# Patient Record
Sex: Female | Born: 1965 | Race: White | Hispanic: No | Smoking: Never smoker
Health system: Southern US, Community
[De-identification: ages and names within clinical notes are randomized; demographics above are authoritative.]

## PROBLEM LIST (undated history)

## (undated) DIAGNOSIS — E785 Hyperlipidemia, unspecified: Secondary | ICD-10-CM

## (undated) DIAGNOSIS — K219 Gastro-esophageal reflux disease without esophagitis: Secondary | ICD-10-CM

## (undated) DIAGNOSIS — I890 Lymphedema, not elsewhere classified: Secondary | ICD-10-CM

## (undated) DIAGNOSIS — M1990 Unspecified osteoarthritis, unspecified site: Secondary | ICD-10-CM

## (undated) DIAGNOSIS — R200 Anesthesia of skin: Secondary | ICD-10-CM

## (undated) DIAGNOSIS — T753XXA Motion sickness, initial encounter: Secondary | ICD-10-CM

## (undated) DIAGNOSIS — M069 Rheumatoid arthritis, unspecified: Secondary | ICD-10-CM

## (undated) DIAGNOSIS — G4730 Sleep apnea, unspecified: Secondary | ICD-10-CM

## (undated) DIAGNOSIS — R112 Nausea with vomiting, unspecified: Secondary | ICD-10-CM

## (undated) DIAGNOSIS — Z9889 Other specified postprocedural states: Secondary | ICD-10-CM

## (undated) HISTORY — DX: Hyperlipidemia, unspecified: E78.5

## (undated) HISTORY — DX: Sleep apnea, unspecified: G47.30

## (undated) HISTORY — PX: NASAL SINUS SURGERY: SHX719

## (undated) HISTORY — PX: ABDOMINAL HYSTERECTOMY: SHX81

## (undated) HISTORY — PX: CHOLECYSTECTOMY: SHX55

---

## 2005-11-25 ENCOUNTER — Ambulatory Visit: Payer: Self-pay | Admitting: Internal Medicine

## 2006-04-08 ENCOUNTER — Ambulatory Visit: Payer: Self-pay | Admitting: Internal Medicine

## 2007-03-03 ENCOUNTER — Ambulatory Visit: Payer: Self-pay | Admitting: Gastroenterology

## 2007-03-03 IMAGING — US ABDOMEN ULTRASOUND
1 series · 17 of 25 positions shown · non-contrast
Comparison: none

REASON FOR EXAM: abd pain   epigastric
COMMENTS:

[Series 1: abdomen ultrasound · 17 of 57 slices shown]
[im 1/57]
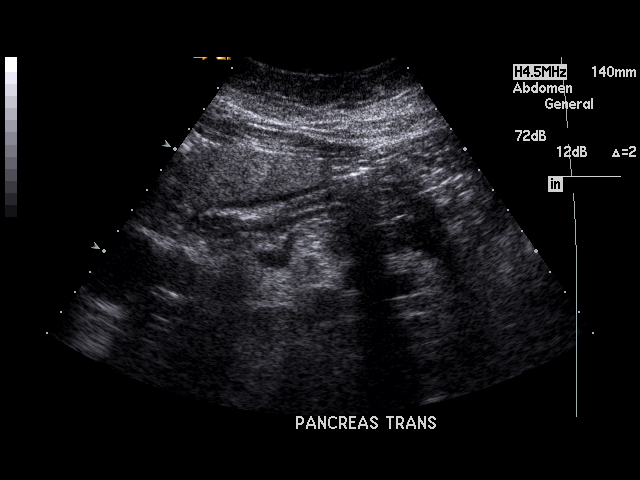
[im 5/57]
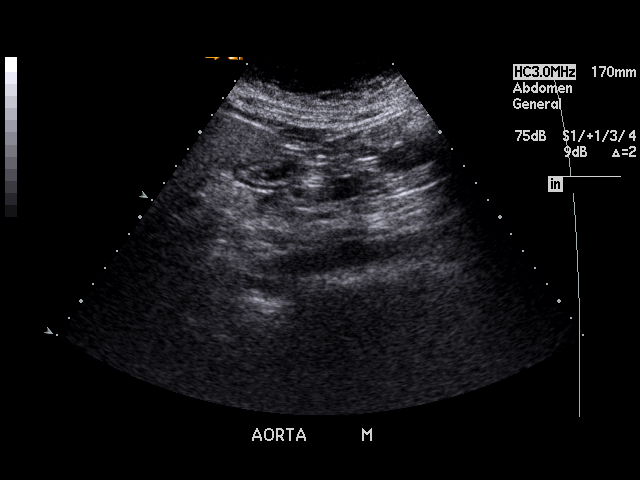
[im 8/57]
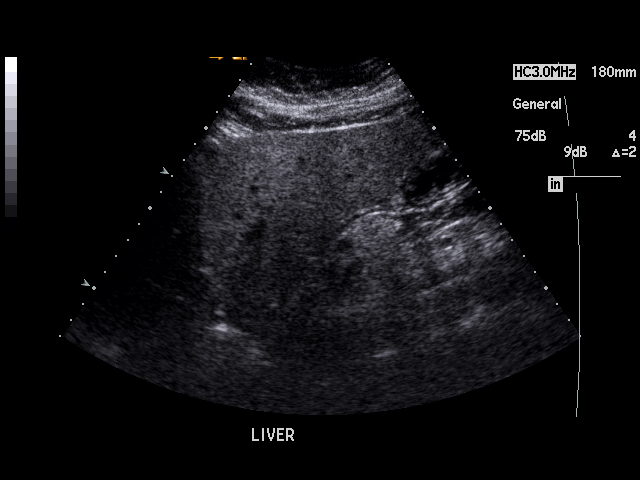
[im 12/57]
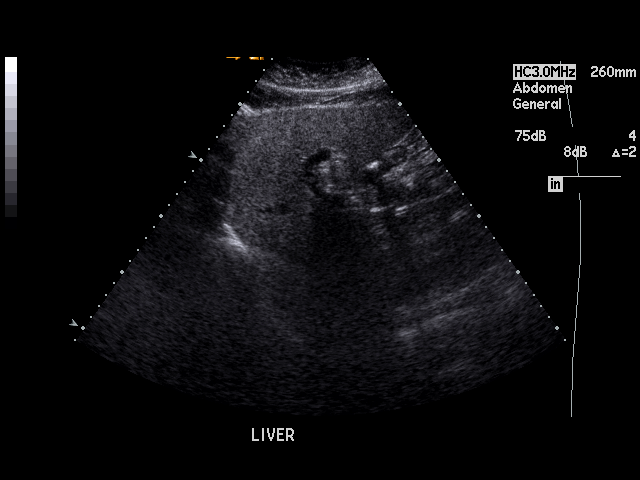
[im 15/57]
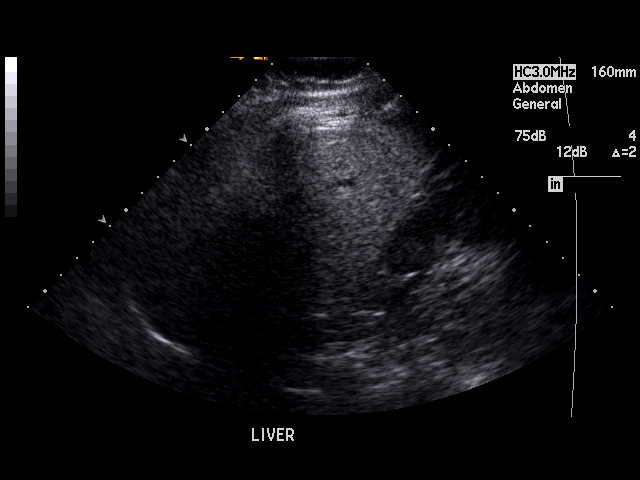
[im 19/57]
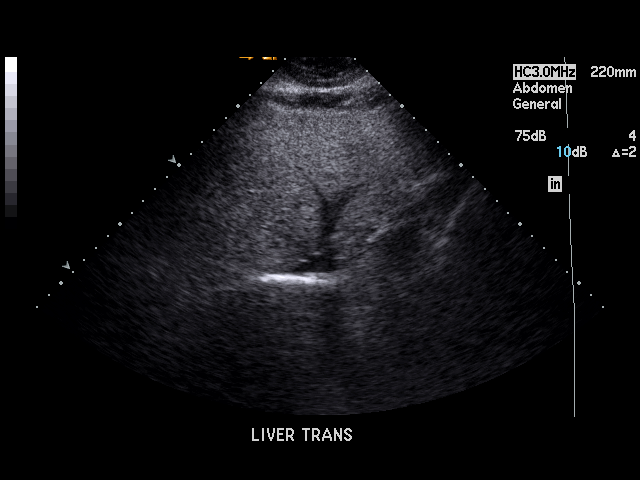
[im 22/57]
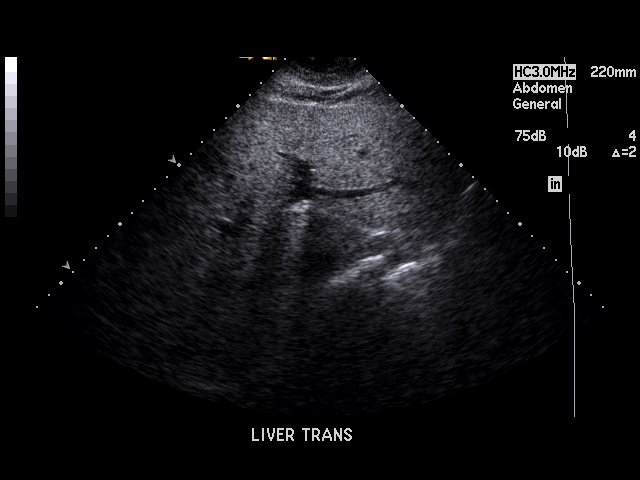
[im 26/57]
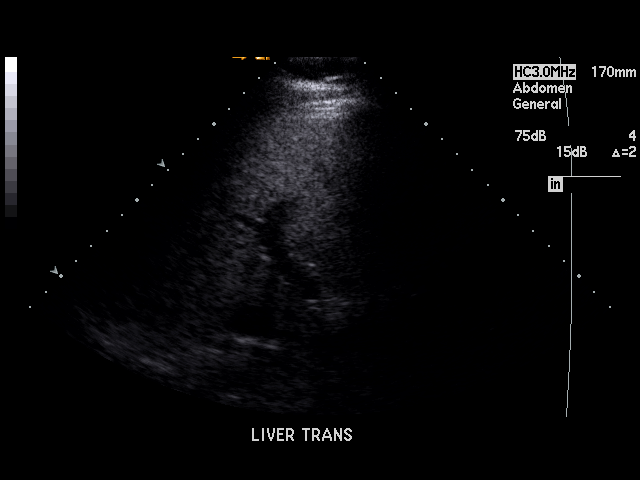
[im 29/57]
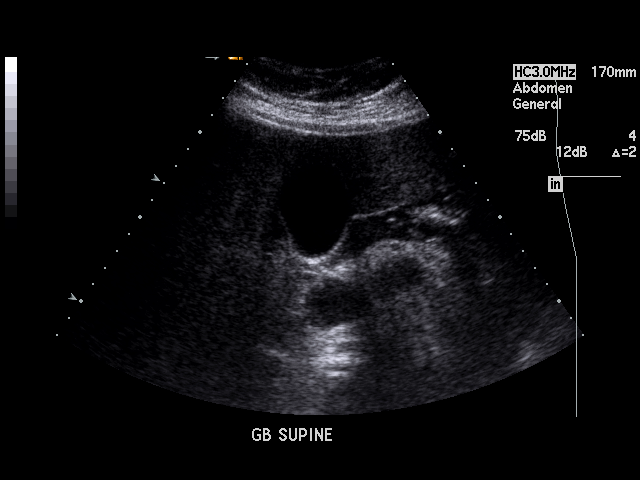
[im 31/57]
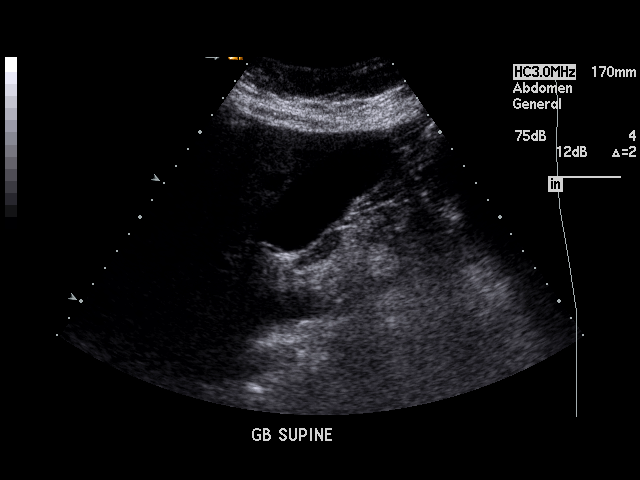
[im 36/57]
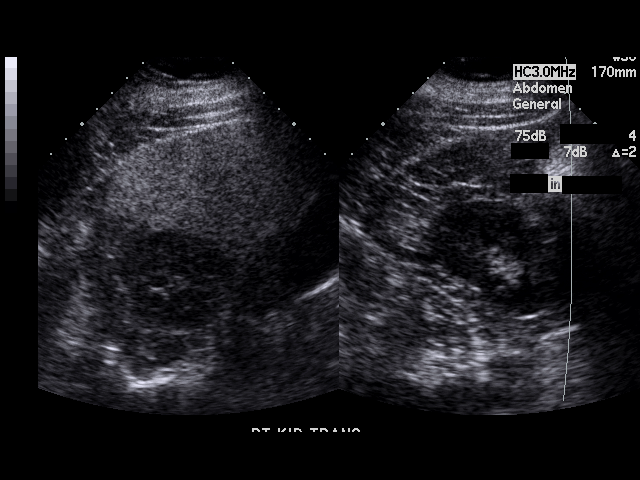
[im 38/57]
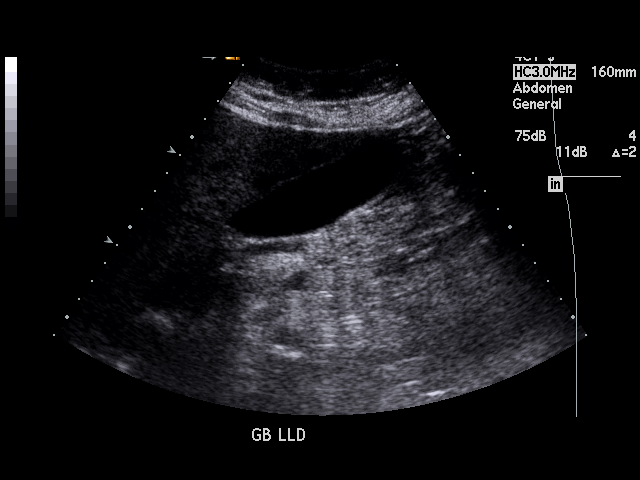
[im 43/57]
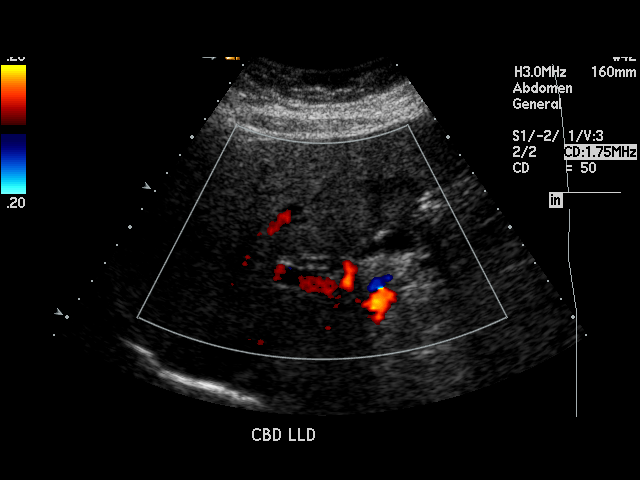
[im 45/57]
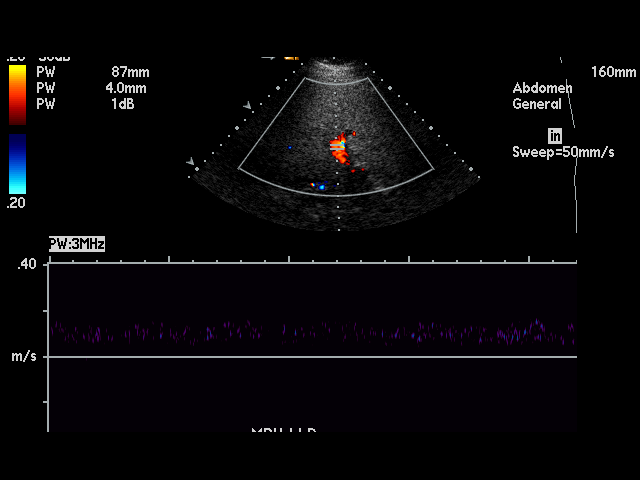
[im 50/57]
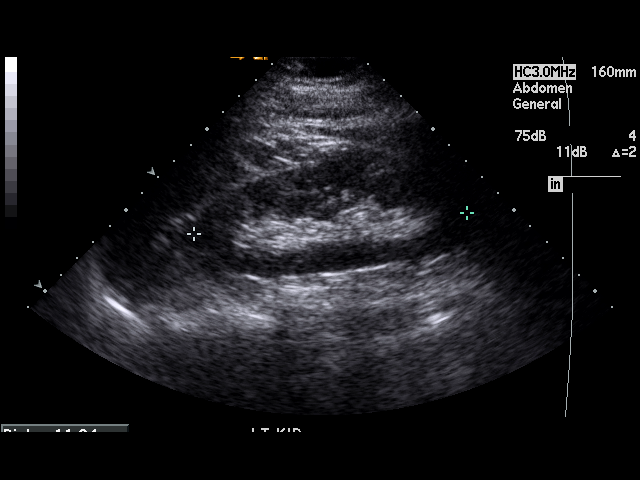
[im 52/57]
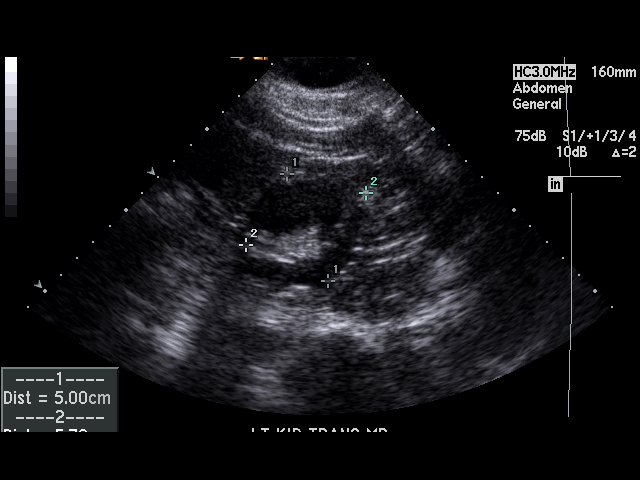
[im 57/57]
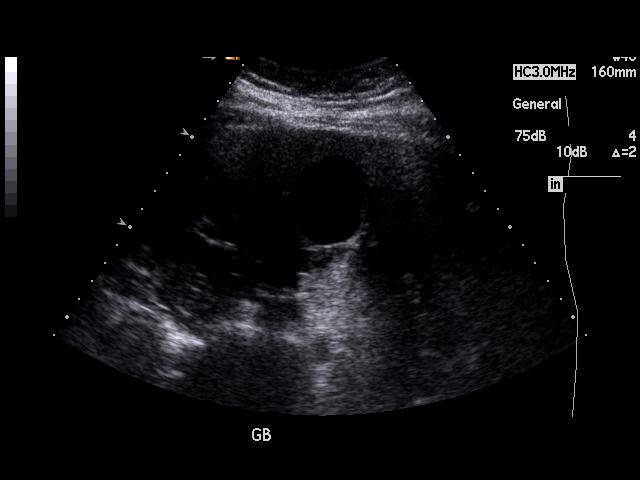

[17 of 25 positions shown; findings below may reference images not displayed]

PROCEDURE:     US  - US ABDOMEN GENERAL SURVEY  - [DATE]  [DATE]

RESULT:     The liver exhibits mildly increased echotexture consistent with
fatty infiltration. No mass or ductal dilation is seen. Portal venous flow
is normal in direction toward the liver. The gallbladder is adequately
distended with no evidence of stones, wall thickening, or pericholecystic
fluid. The common bile duct is normal at 3.2 mm in diameter. Portions of the
pancreas were demonstrated and these appeared normal. The spleen, abdominal
aorta, and kidneys are normal in appearance.
IMPRESSION: 1. There is no evidence of obstruction of the biliary tree. No gallstones
are identified.
2. There is limited evaluation of the pancreas.
3. There are findings consistent with fatty infiltration of the liver.

## 2007-03-15 ENCOUNTER — Ambulatory Visit: Payer: Self-pay | Admitting: General Surgery

## 2007-03-20 ENCOUNTER — Ambulatory Visit: Payer: Self-pay | Admitting: General Surgery

## 2007-06-09 ENCOUNTER — Ambulatory Visit: Payer: Self-pay | Admitting: Gastroenterology

## 2008-08-21 ENCOUNTER — Ambulatory Visit: Payer: Self-pay | Admitting: Internal Medicine

## 2008-09-12 ENCOUNTER — Ambulatory Visit: Payer: Self-pay | Admitting: Internal Medicine

## 2009-02-25 ENCOUNTER — Ambulatory Visit: Payer: Self-pay

## 2010-03-05 ENCOUNTER — Ambulatory Visit: Payer: Self-pay | Admitting: Otolaryngology

## 2012-03-28 ENCOUNTER — Ambulatory Visit: Payer: Self-pay | Admitting: Obstetrics & Gynecology

## 2012-03-28 LAB — CBC
HCT: 37.3 % (ref 35.0–47.0)
MCH: 32.3 pg (ref 26.0–34.0)
MCHC: 34.7 g/dL (ref 32.0–36.0)
Platelet: 187 10*3/uL (ref 150–440)
RBC: 4.01 10*6/uL (ref 3.80–5.20)
RDW: 12.9 % (ref 11.5–14.5)
WBC: 7.6 10*3/uL (ref 3.6–11.0)

## 2012-03-28 LAB — PREGNANCY, URINE: Pregnancy Test, Urine: NEGATIVE m[IU]/mL

## 2012-04-06 ENCOUNTER — Ambulatory Visit: Payer: Self-pay | Admitting: Obstetrics & Gynecology

## 2012-04-07 LAB — PATHOLOGY REPORT

## 2012-09-19 ENCOUNTER — Ambulatory Visit: Payer: Self-pay | Admitting: Obstetrics & Gynecology

## 2012-09-26 ENCOUNTER — Ambulatory Visit: Payer: Self-pay | Admitting: Obstetrics & Gynecology

## 2012-09-26 LAB — CBC
HCT: 39.6 % (ref 35.0–47.0)
HGB: 13.1 g/dL (ref 12.0–16.0)
MCH: 30.4 pg (ref 26.0–34.0)
MCHC: 33 g/dL (ref 32.0–36.0)
MCV: 92 fL (ref 80–100)
Platelet: 228 10*3/uL (ref 150–440)
RBC: 4.31 10*6/uL (ref 3.80–5.20)
WBC: 11.6 10*3/uL — ABNORMAL HIGH (ref 3.6–11.0)

## 2014-01-08 DIAGNOSIS — E785 Hyperlipidemia, unspecified: Secondary | ICD-10-CM | POA: Insufficient documentation

## 2014-01-08 DIAGNOSIS — Z79899 Other long term (current) drug therapy: Secondary | ICD-10-CM | POA: Insufficient documentation

## 2014-01-08 DIAGNOSIS — I82409 Acute embolism and thrombosis of unspecified deep veins of unspecified lower extremity: Secondary | ICD-10-CM | POA: Insufficient documentation

## 2014-01-08 DIAGNOSIS — G4733 Obstructive sleep apnea (adult) (pediatric): Secondary | ICD-10-CM | POA: Insufficient documentation

## 2014-01-29 ENCOUNTER — Emergency Department: Payer: Self-pay | Admitting: Emergency Medicine

## 2014-01-29 LAB — URINALYSIS, COMPLETE
BILIRUBIN, UR: NEGATIVE
Glucose,UR: NEGATIVE mg/dL (ref 0–75)
KETONE: NEGATIVE
LEUKOCYTE ESTERASE: NEGATIVE
NITRITE: NEGATIVE
Ph: 5 (ref 4.5–8.0)
Protein: NEGATIVE
RBC,UR: <1 /HPF (ref 0–5)
SPECIFIC GRAVITY: 1.017 (ref 1.003–1.030)
Squamous Epithelial: 1
WBC UR: 1 /HPF (ref 0–5)

## 2014-01-29 LAB — CBC WITH DIFFERENTIAL/PLATELET
Basophil #: 0.1 10*3/uL (ref 0.0–0.1)
Basophil %: 1 %
EOS ABS: 0.2 10*3/uL (ref 0.0–0.7)
Eosinophil %: 1.5 %
HCT: 41.3 % (ref 35.0–47.0)
HGB: 13.4 g/dL (ref 12.0–16.0)
Lymphocyte #: 3 10*3/uL (ref 1.0–3.6)
Lymphocyte %: 23.7 %
MCH: 30.3 pg (ref 26.0–34.0)
MCHC: 32.3 g/dL (ref 32.0–36.0)
MCV: 94 fL (ref 80–100)
MONOS PCT: 8.3 %
Monocyte #: 1 x10 3/mm — ABNORMAL HIGH (ref 0.2–0.9)
NEUTROS PCT: 65.5 %
Neutrophil #: 8.3 10*3/uL — ABNORMAL HIGH (ref 1.4–6.5)
Platelet: 229 10*3/uL (ref 150–440)
RBC: 4.41 10*6/uL (ref 3.80–5.20)
RDW: 13.8 % (ref 11.5–14.5)
WBC: 12.6 10*3/uL — AB (ref 3.6–11.0)

## 2014-01-29 LAB — PROTIME-INR
INR: 1.1
PROTHROMBIN TIME: 14 s (ref 11.5–14.7)

## 2014-05-07 ENCOUNTER — Ambulatory Visit: Payer: Self-pay | Admitting: Internal Medicine

## 2014-06-14 ENCOUNTER — Ambulatory Visit: Payer: Self-pay | Admitting: Internal Medicine

## 2014-06-18 ENCOUNTER — Ambulatory Visit: Payer: Self-pay | Admitting: Physician Assistant

## 2014-07-15 DIAGNOSIS — I82409 Acute embolism and thrombosis of unspecified deep veins of unspecified lower extremity: Secondary | ICD-10-CM

## 2014-07-15 HISTORY — DX: Acute embolism and thrombosis of unspecified deep veins of unspecified lower extremity: I82.409

## 2014-12-10 NOTE — Op Note (Signed)
PATIENT NAME:  Sharon Barnes, Sharon Barnes MR#:  [id number] DATE OF BIRTH:  [birth year]  DATE OF PROCEDURE:  04/06/2012  PREOPERATIVE DIAGNOSES:  1. Retained IUD. 2. Menorrhagia. 3. Fibroids.   POSTOPERATIVE DIAGNOSES:  1. Retained IUD. 2. Menorrhagia. 3. Fibroids.   PROCEDURES PERFORMED:  1. Hysteroscopy. 2. Dilation and curettage. 3. Myomectomy (resection of fibroid). 4. Removal of retained intrauterine device.   SURGEON: Glean Salen, MD  ANESTHESIA: General.   ESTIMATED BLOOD LOSS: Minimal.   COMPLICATIONS: None.   FINDINGS: IUD in uterine cavity. Two submucosal fibroids. One fibroid completely submucosal at the fundus and another fibroid that is partially submucosal impinging into the intrauterine cavity.   SPECIMEN: Fibroids.   DISPOSITION: To recovery room in stable condition.   TECHNIQUE: Patient is prepped and draped in the usual sterile fashion after adequate anesthesia is obtained in the dorsal lithotomy position. The bladder is drained with a Robinson catheter. 30 degree hysteroscope is inserted with glycine distention of intrauterine cavity with the above-mentioned findings noted. The hysteroscope is removed and a grasping forceps is placed within the uterine cavity with removal of intrauterine device. A resectoscope is then inserted with continued glycine fluid management. Using a curved wire loop resectoscope the fibroids are carefully excised and removed using coagulation power. Some bleeding is noted but hemostasis is assured at the conclusion of the case. Specimen is retrieved and sent to pathology for further review. Patient has a minimal discrepancy of glycine fluid at the conclusion of the case. Patient tolerates well and goes to recovery room in stable condition. All sponge, instrument, and needle counts are correct.   ____________________________ R. Barnett Applebaum, MD rph:cms D: 04/06/2012 13:57:22 ET T: 04/06/2012 15:33:01 ET JOB#: [id number]  cc: Glean Salen, MD,  <Dictator> Gae Dry MD ELECTRONICALLY SIGNED 04/07/2012 7:08

## 2014-12-13 NOTE — Op Note (Signed)
PATIENT NAME:  Sharon Barnes, Sharon Barnes MR#:  [id number] DATE OF BIRTH:  07-10-1966  DATE OF PROCEDURE:  09/26/2012  PREOPERATIVE DIAGNOSIS:    Fibroid uterus with menometrorrhagia.   POSTOPERATIVE DIAGNOSIS:  Fibroid uterus with menometrorrhagia.  PROCEDURE PERFORMED:  Laparoscopic supracervical hysterectomy.  SURGEON:  Glean Salen, MD  ASSISTANT:  Prentice Docker, MD  ANESTHESIA:   General.  ESTIMATED BLOOD LOSS:  50 mL  COMPLICATIONS:  None.  FINDINGS:  Fibroid enlarged uterus, likely greater than 250 grams, normal ovaries, no adhesions.  DISPOSITION:  To recovery room in stable condition.  PROCEDURE IN DETAIL:  The patient was prepped and draped in the usual sterile fashion after adequate anesthesia was obtained in the dorsal lithotomy position.  A Foley catheter was inserted and a sponge stick placed per vagina for manipulation purposes.  Attention was then turned to the abdomen where a Veress needle was inserted through a 5-mm infraumbilical incision after Marcaine infused to anesthetize the skin.  The Veress needle was placed and is confirmed using the hanging drop technique and the abdomen is then insufflated with CO2 gas.  A 5 mm trocar was then under direct visualization with the laparoscope with no injuries or bleeding noted.  The patient was placed in Trendelenburg position with the above-mentioned findings visualized.  A 5 mm trocar was placed in the left lower quadrant and a 12 mm trocar was placed in the right lower quadrant lateral to the inferior epigastric blood vessels with no injuries or bleeding noted.  The uterus was grasped and the uterine ovarian blood vessels and the ligaments were carefully coagulated with the bipolar cautery, cauterized and cut using the 5 mm Harmonic scalpel.  With further dissection, using the Harmonic scalpel to dissect the fallopian tube and round ligament and broad ligament complex all the way down to the level of the uterine arteries.  The  uterine arteries at this point were then coagulated carefully using the bipolar cautery device and incised with subsequent amputation of the uterus at the level of the cervix with approximately 1 cm length of cervix left in place.  The endocervical canal was cauterized using electrocautery.    The morcellator device was placed through the right lower quadrant incision after the trocars were removed and the uterus was removed by morcellating processing without complication. The pelvic cavity was irrigated with aspiration of all fluid with excellent hemostasis noted.  Examination of the ureter, bowel and bladder reveals no apparent injury or complications.  Intercede was placed over the cervical stump area.  Vicryl suture was used to close the right lower quadrant fascia after the morcellator device was removed.  Gas was expelled, the trocars were removed and skin was closed with Dermabond.   Cystoscopy was performed with saline distention of the bladder and observation of ureter flow.  Indigo carmine had been injected through the IV and blue urine was seen to egress from each ureteral orifice.  The cystoscope was removed and the Foley catheter was replaced and goes to the recovery room with the patient.  The patient does go to the recovery room in stable condition, having tolerated the procedure well.  All sponge, instrument and needle counts were correct.    ____________________________ R. Barnett Applebaum, MD rph:ct D: 09/26/2012 11:20:31 ET T: 09/26/2012 13:05:33 ET JOB#: [id number]  cc: Glean Salen, MD, <Dictator> Gae Dry MD ELECTRONICALLY SIGNED 09/27/2012 3:26

## 2015-02-03 DIAGNOSIS — R739 Hyperglycemia, unspecified: Secondary | ICD-10-CM | POA: Insufficient documentation

## 2015-04-02 ENCOUNTER — Other Ambulatory Visit: Payer: Self-pay | Admitting: Obstetrics & Gynecology

## 2015-04-02 DIAGNOSIS — R928 Other abnormal and inconclusive findings on diagnostic imaging of breast: Secondary | ICD-10-CM

## 2015-04-08 ENCOUNTER — Ambulatory Visit
Admission: RE | Admit: 2015-04-08 | Discharge: 2015-04-08 | Disposition: A | Discharge status: Home / Self Care | Payer: BC Managed Care – PPO | Source: Ambulatory Visit | Attending: Obstetrics & Gynecology | Admitting: Obstetrics & Gynecology

## 2015-04-08 DIAGNOSIS — R928 Other abnormal and inconclusive findings on diagnostic imaging of breast: Secondary | ICD-10-CM

## 2015-10-14 ENCOUNTER — Other Ambulatory Visit
Admission: RE | Admit: 2015-10-14 | Discharge: 2015-10-14 | Disposition: A | Discharge status: Home / Self Care | Payer: BC Managed Care – PPO | Source: Ambulatory Visit | Attending: Physician Assistant | Admitting: Physician Assistant

## 2015-10-14 DIAGNOSIS — M7989 Other specified soft tissue disorders: Secondary | ICD-10-CM | POA: Insufficient documentation

## 2015-10-14 LAB — FIBRIN DERIVATIVES D-DIMER (ARMC ONLY): Fibrin derivatives D-dimer (ARMC): 259 (ref 0–499)

## 2015-12-03 ENCOUNTER — Other Ambulatory Visit: Payer: Self-pay | Admitting: Obstetrics & Gynecology

## 2015-12-03 DIAGNOSIS — N6310 Unspecified lump in the right breast, unspecified quadrant: Secondary | ICD-10-CM

## 2015-12-10 ENCOUNTER — Ambulatory Visit
Admission: RE | Admit: 2015-12-10 | Discharge: 2015-12-10 | Disposition: A | Discharge status: Home / Self Care | Payer: BC Managed Care – PPO | Source: Ambulatory Visit | Attending: Obstetrics & Gynecology | Admitting: Obstetrics & Gynecology

## 2015-12-10 DIAGNOSIS — N6310 Unspecified lump in the right breast, unspecified quadrant: Secondary | ICD-10-CM

## 2016-02-25 ENCOUNTER — Ambulatory Visit: Payer: BC Managed Care – PPO | Attending: Specialist

## 2016-02-25 DIAGNOSIS — G4733 Obstructive sleep apnea (adult) (pediatric): Secondary | ICD-10-CM | POA: Insufficient documentation

## 2016-06-22 ENCOUNTER — Ambulatory Visit (INDEPENDENT_AMBULATORY_CARE_PROVIDER_SITE_OTHER): Payer: BC Managed Care – PPO | Admitting: Vascular Surgery

## 2016-06-22 ENCOUNTER — Encounter (INDEPENDENT_AMBULATORY_CARE_PROVIDER_SITE_OTHER): Payer: Self-pay | Admitting: Vascular Surgery

## 2016-06-22 VITALS — BP 154/95 | HR 82 | Resp 17 | Ht 65.0 in | Wt 221.0 lb

## 2016-06-22 DIAGNOSIS — I890 Lymphedema, not elsewhere classified: Secondary | ICD-10-CM

## 2016-06-22 DIAGNOSIS — M79605 Pain in left leg: Secondary | ICD-10-CM | POA: Diagnosis not present

## 2016-06-22 DIAGNOSIS — M79604 Pain in right leg: Secondary | ICD-10-CM | POA: Diagnosis not present

## 2016-06-22 DIAGNOSIS — M7989 Other specified soft tissue disorders: Secondary | ICD-10-CM

## 2016-06-28 DIAGNOSIS — M79609 Pain in unspecified limb: Secondary | ICD-10-CM | POA: Insufficient documentation

## 2016-06-28 DIAGNOSIS — M7989 Other specified soft tissue disorders: Secondary | ICD-10-CM | POA: Insufficient documentation

## 2016-06-28 DIAGNOSIS — I890 Lymphedema, not elsewhere classified: Secondary | ICD-10-CM | POA: Insufficient documentation

## 2016-06-28 NOTE — Progress Notes (Signed)
MRN : [id number]  Sharon Barnes is a 50 y.o. ([birth year]) female who presents with chief complaint of  Chief Complaint  Patient presents with  . Follow-up  .  History of Present Illness: Patient returns today in follow up of Her leg pain and swelling. Her lymphedema pump was denied by insurance. She has been elevating her   legs and using compression socks with no significant improvement in the swelling since her last visit. The discomfort may be a little bit better. She has no fever or chills. She has no ulceration or infection.  Current Outpatient Prescriptions  Medication Sig Dispense Refill  . aspirin EC 81 MG tablet Take by mouth.    . Calcium Carbonate-Vitamin D (CALCIUM-VITAMIN D) 500-200 MG-UNIT tablet Take by mouth.    . clonazePAM (KLONOPIN) 0.5 MG tablet Take by mouth.    . Cyanocobalamin (RA VITAMIN B-12 TR) 1000 MCG TBCR Take by mouth.    . Multiple Vitamin (MULTI-VITAMINS) TABS Take by mouth.     No current facility-administered medications for this visit.     History reviewed. No pertinent past medical history.  Past Surgical History:  Procedure Laterality Date  . ABDOMINAL HYSTERECTOMY    . CHOLECYSTECTOMY    . NASAL SINUS SURGERY      Social History Social History  Substance Use Topics  . Smoking status: Never Smoker  . Smokeless tobacco: Never Used  . Alcohol use Yes   No IV drug use   Family History Family History  Problem Relation Age of Onset  . Heart disease Mother   . Cancer Father   . Heart disease Maternal Grandmother   . Cancer Maternal Grandfather   . Emphysema Paternal Grandmother      No Known Allergies   REVIEW OF SYSTEMS (Negative unless checked)  Constitutional: [] Weight loss  [] Fever  [] Chills Cardiac: [] Chest pain   [] Chest pressure   [] Palpitations   [] Shortness of breath when laying flat   [] Shortness of breath at rest   [] Shortness of breath with exertion. Vascular:  [] Pain in legs with walking   [x] Pain in legs  at rest   [] Pain in legs when laying flat   [] Claudication   [] Pain in feet when walking  [] Pain in feet at rest  [] Pain in feet when laying flat   [] History of DVT   [] Phlebitis   [x] Swelling in legs   [] Varicose veins   [] Non-healing ulcers Pulmonary:   [] Uses home oxygen   [] Productive cough   [] Hemoptysis   [] Wheeze  [] COPD   [] Asthma Neurologic:  [] Dizziness  [] Blackouts   [] Seizures   [] History of stroke   [] History of TIA  [] Aphasia   [] Temporary blindness   [] Dysphagia   [] Weakness or numbness in arms   [] Weakness or numbness in legs Musculoskeletal:  [] Arthritis   [] Joint swelling   [] Joint pain   [] Low back pain Hematologic:  [] Easy bruising  [] Easy bleeding   [] Hypercoagulable state   [] Anemic   Gastrointestinal:  [] Blood in stool   [] Vomiting blood  [] Gastroesophageal reflux/heartburn   [] Abdominal pain Genitourinary:  [] Chronic kidney disease   [] Difficult urination  [] Frequent urination  [] Burning with urination   [] Hematuria Skin:  [] Rashes   [] Ulcers   [] Wounds Psychological:  [] History of anxiety   []  History of major depression.  Physical Examination  BP (!) 154/95   Pulse 82   Resp 17   Ht 5\' 5"  (1.651 m)   Wt 100.2 kg (221 lb)   BMI  36.78 kg/m  Gen:  WD/WN, NAD Head: [place]/AT, No temporalis wasting. Ear/Nose/Throat: Hearing grossly intact, nares w/o erythema or drainage, trachea midline Eyes: Conjunctiva clear. Sclera non-icteric Neck: Supple.  No JVD.  Pulmonary:  Good air movement, no use of accessory muscles.  Cardiac: RRR, normal S1, S2 Vascular:  Vessel Right Left  Radial Palpable Palpable  Ulnar Palpable Palpable  Brachial Palpable Palpable  Carotid Palpable, without bruit Palpable, without bruit  Aorta Not palpable N/A  Femoral Palpable Palpable  Popliteal Palpable Palpable  PT Palpable Palpable  DP Palpable Palpable   Gastrointestinal: soft, non-tender/non-distended. No guarding/reflex.  Musculoskeletal: M/S 5/5 throughout.  No deformity or atrophy.  1-2+ BLE edema. Neurologic: Sensation grossly intact in extremities.  Symmetrical.  Speech is fluent.  Psychiatric: Judgment intact, Mood & affect appropriate for pt's clinical situation. Dermatologic: No rashes or ulcers noted.  No cellulitis or open wounds. Lymph : No Cervical, Axillary, or Inguinal lymphadenopathy.      Labs No results found for this or any previous visit (from the past 2160 hour(s)).  Radiology No results found.    Assessment/Plan  Pain in limb Stable to improved. Compression, elevation, and anti-inflammatories as needed  Swelling of limb Not particularly changed. Did not get her lymphedema pump approved by insurance.  Lymphedema Not particularly changed. Did not get her lymphedema pump improved by insurance. She really does not want to proceed with that at this point and feels that things are reasonably stable.    Leotis Pain, MD  06/28/2016 1:48 PM    This note was created with Dragon medical transcription system.  Any errors from dictation are purely unintentional

## 2016-06-28 NOTE — Assessment & Plan Note (Signed)
Not particularly changed. Did not get her lymphedema pump approved by insurance.

## 2016-06-28 NOTE — Assessment & Plan Note (Signed)
Stable to improved. Compression, elevation, and anti-inflammatories as needed

## 2016-06-28 NOTE — Assessment & Plan Note (Signed)
Not particularly changed. Did not get her lymphedema pump improved by insurance. She really does not want to proceed with that at this point and feels that things are reasonably stable.

## 2016-08-11 ENCOUNTER — Other Ambulatory Visit: Payer: Self-pay | Admitting: Obstetrics & Gynecology

## 2016-08-11 DIAGNOSIS — N63 Unspecified lump in unspecified breast: Secondary | ICD-10-CM

## 2016-08-20 ENCOUNTER — Ambulatory Visit
Admission: RE | Admit: 2016-08-20 | Discharge: 2016-08-20 | Disposition: A | Discharge status: Home / Self Care | Payer: BC Managed Care – PPO | Source: Ambulatory Visit | Attending: Obstetrics & Gynecology | Admitting: Obstetrics & Gynecology

## 2016-08-20 ENCOUNTER — Other Ambulatory Visit: Payer: Self-pay | Admitting: Obstetrics & Gynecology

## 2016-08-20 DIAGNOSIS — N63 Unspecified lump in unspecified breast: Secondary | ICD-10-CM

## 2016-12-31 ENCOUNTER — Other Ambulatory Visit: Payer: Self-pay | Admitting: Internal Medicine

## 2016-12-31 DIAGNOSIS — M7989 Other specified soft tissue disorders: Secondary | ICD-10-CM

## 2017-01-06 ENCOUNTER — Ambulatory Visit
Admission: RE | Admit: 2017-01-06 | Discharge: 2017-01-06 | Disposition: A | Discharge status: Home / Self Care | Payer: BC Managed Care – PPO | Source: Ambulatory Visit | Attending: Internal Medicine | Admitting: Internal Medicine

## 2017-01-06 DIAGNOSIS — M7989 Other specified soft tissue disorders: Secondary | ICD-10-CM | POA: Insufficient documentation

## 2017-02-17 DIAGNOSIS — G43909 Migraine, unspecified, not intractable, without status migrainosus: Secondary | ICD-10-CM | POA: Insufficient documentation

## 2017-02-22 ENCOUNTER — Ambulatory Visit (INDEPENDENT_AMBULATORY_CARE_PROVIDER_SITE_OTHER): Payer: BC Managed Care – PPO | Admitting: Vascular Surgery

## 2017-02-22 ENCOUNTER — Encounter (INDEPENDENT_AMBULATORY_CARE_PROVIDER_SITE_OTHER): Payer: Self-pay | Admitting: Vascular Surgery

## 2017-02-22 VITALS — BP 132/81 | HR 68 | Resp 15 | Ht 65.0 in | Wt 216.0 lb

## 2017-02-22 DIAGNOSIS — I890 Lymphedema, not elsewhere classified: Secondary | ICD-10-CM | POA: Diagnosis not present

## 2017-02-22 DIAGNOSIS — M7989 Other specified soft tissue disorders: Secondary | ICD-10-CM

## 2017-02-22 NOTE — Assessment & Plan Note (Signed)
The patient has class II lymphedema which has steadily worsened. She has previously tried compression stockings without significant improvement. As the Axid made worse because they were poorly fitting. I have written her for a new prescription for compression stockings and she may need to get custom fit stockings. I will also written her a prescription for a left arm sleeve and she appears to have significant swelling in this arm as well. Given the fact she has not had breast surgery or chest radiation, the source of her left arm lymphedema is not entirely clear at this point. Compression and elevation certainly need to be used. At this point, with worsening symptoms I would believe she would benefit from a lymphedema pump for both lower extremities as well as potentially her left arm. We will try to get that approved. I will see her back in 3-4 months to see how she is doing with the pump.

## 2017-02-22 NOTE — Patient Instructions (Signed)

## 2017-02-22 NOTE — Progress Notes (Signed)
MRN : [id number]  Sharon Barnes is a 51 y.o. (Jan 23, 1966) female who presents with chief complaint of  Chief Complaint  Patient presents with  . Re-evaluation    Lymphedema  .  History of Present Illness: Patient returns today in follow up of lymphedema. Since her last visit, she has developed worsening swelling not only in her right leg but also in her left leg and her left arm. She was not able to wear the compression stockings and was never able to get a lymphedema pump. Her symptoms have gradually worsened over several months. She has no fevers or chills. She has no chest pain or shortness of breath.  Current Outpatient Prescriptions  Medication Sig Dispense Refill  . aspirin EC 81 MG tablet Take by mouth.    . Calcium Carbonate-Vitamin D (CALCIUM-VITAMIN D) 500-200 MG-UNIT tablet Take by mouth.    . cetirizine (ZYRTEC) 10 MG tablet Take by mouth.    . clonazePAM (KLONOPIN) 0.5 MG tablet Take by mouth.    . Cyanocobalamin (RA VITAMIN B-12 TR) 1000 MCG TBCR Take by mouth.    . furosemide (LASIX) 20 MG tablet TAKE 1 TABLET (20 MG TOTAL) BY MOUTH ONCE DAILY AS NEEDED FOR EDEMA.  0  . Multiple Vitamin (MULTI-VITAMINS) TABS Take by mouth.    Marland Kitchen XIIDRA 5 % SOLN PLACE 1 DROP INTO BOTH EYES TWICE A DAY  5   No current facility-administered medications for this visit.     Past Medical History:  Diagnosis Date  . Hyperlipidemia   . Sleep apnea     Past Surgical History:  Procedure Laterality Date  . ABDOMINAL HYSTERECTOMY    . CHOLECYSTECTOMY    . NASAL SINUS SURGERY      Social History     Social History  Substance Use Topics  . Smoking status: Never Smoker  . Smokeless tobacco: Never Used  . Alcohol use Yes   No IV drug use   Family History      Family History  Problem Relation Age of Onset  . Heart disease Mother   . Cancer Father   . Heart disease Maternal Grandmother   . Cancer Maternal Grandfather   . Emphysema Paternal Grandmother      No  Known Allergies   REVIEW OF SYSTEMS (Negative unless checked)  Constitutional: [] Weight loss  [] Fever  [] Chills Cardiac: [] Chest pain   [] Chest pressure   [] Palpitations   [] Shortness of breath when laying flat   [] Shortness of breath at rest   [] Shortness of breath with exertion. Vascular:  [] Pain in legs with walking   [x] Pain in legs at rest   [] Pain in legs when laying flat   [] Claudication   [] Pain in feet when walking  [] Pain in feet at rest  [] Pain in feet when laying flat   [] History of DVT   [] Phlebitis   [x] Swelling in legs   [] Varicose veins   [] Non-healing ulcers Pulmonary:   [] Uses home oxygen   [] Productive cough   [] Hemoptysis   [] Wheeze  [] COPD   [] Asthma Neurologic:  [] Dizziness  [] Blackouts   [] Seizures   [] History of stroke   [] History of TIA  [] Aphasia   [] Temporary blindness   [] Dysphagia   [] Weakness or numbness in arms   [x] Weakness or numbness in legs Musculoskeletal:  [] Arthritis   [] Joint swelling   [] Joint pain   [] Low back pain Hematologic:  [] Easy bruising  [] Easy bleeding   [] Hypercoagulable state   [] Anemic   Gastrointestinal:  []   Blood in stool   [] Vomiting blood  [] Gastroesophageal reflux/heartburn   [] Abdominal pain Genitourinary:  [] Chronic kidney disease   [] Difficult urination  [] Frequent urination  [] Burning with urination   [] Hematuria Skin:  [] Rashes   [] Ulcers   [] Wounds Psychological:  [] History of anxiety   []  History of major depression.  Physical Examination  BP 132/81 (BP Location: Right Arm)   Pulse 68   Resp 15   Ht 5\' 5"  (1.651 m)   Wt 98 kg (216 lb)   BMI 35.94 kg/m  Gen:  WD/WN, NAD. Obese Head: [place]/AT, No temporalis wasting. Ear/Nose/Throat: Hearing grossly intact, nares w/o erythema or drainage, trachea midline Eyes: Conjunctiva clear. Sclera non-icteric Neck: Supple.  No JVD.  Pulmonary:  Good air movement, no use of accessory muscles.  Cardiac: RRR, normal S1, S2 Vascular:  Vessel Right Left  Radial Palpable Palpable                           PT 1+ Palpable 1+ Palpable  DP Palpable Palpable    Musculoskeletal: M/S 5/5 throughout.  No deformity or atrophy. 1-2+ bilateral lower extremity edema. Also now with 1-2+ left upper extremity swelling. Right upper extremity has trace swelling Neurologic: Sensation grossly intact in extremities.  Symmetrical.  Speech is fluent.  Psychiatric: Judgment intact, Mood & affect appropriate for pt's clinical situation. Dermatologic: No rashes or ulcers noted.  No cellulitis or open wounds.       Labs No results found for this or any previous visit (from the past 2160 hour(s)).  Radiology No results found.   Assessment/Plan  Lymphedema The patient has class II lymphedema which has steadily worsened. She has previously tried compression stockings without significant improvement. As the Axid made worse because they were poorly fitting. I have written her for a new prescription for compression stockings and she may need to get custom fit stockings. I will also written her a prescription for a left arm sleeve and she appears to have significant swelling in this arm as well. Given the fact she has not had breast surgery or chest radiation, the source of her left arm lymphedema is not entirely clear at this point. Compression and elevation certainly need to be used. At this point, with worsening symptoms I would believe she would benefit from a lymphedema pump for both lower extremities as well as potentially her left arm. We will try to get that approved. I will see her back in 3-4 months to see how she is doing with the pump.  Swelling of limb The patient has class II lymphedema which has steadily worsened. She has previously tried compression stockings without significant improvement. As the Axid made worse because they were poorly fitting. I have written her for a new prescription for compression stockings and she may need to get custom fit stockings. I will also written her a  prescription for a left arm sleeve and she appears to have significant swelling in this arm as well. Given the fact she has not had breast surgery or chest radiation, the source of her left arm lymphedema is not entirely clear at this point. Compression and elevation certainly need to be used. At this point, with worsening symptoms I would believe she would benefit from a lymphedema pump for both lower extremities as well as potentially her left arm. We will try to get that approved. I will see her back in 3-4 months to see how she is doing  with the pump.    Leotis Pain, MD  02/22/2017 12:52 PM    This note was created with Dragon medical transcription system.  Any errors from dictation are purely unintentional

## 2017-06-09 ENCOUNTER — Other Ambulatory Visit: Payer: Self-pay | Admitting: Internal Medicine

## 2017-06-09 DIAGNOSIS — R42 Dizziness and giddiness: Secondary | ICD-10-CM

## 2017-06-15 ENCOUNTER — Ambulatory Visit
Admission: RE | Admit: 2017-06-15 | Discharge: 2017-06-15 | Disposition: A | Discharge status: Home / Self Care | Payer: BC Managed Care – PPO | Source: Ambulatory Visit | Attending: Internal Medicine | Admitting: Internal Medicine

## 2017-06-15 DIAGNOSIS — R42 Dizziness and giddiness: Secondary | ICD-10-CM | POA: Diagnosis not present

## 2017-06-17 ENCOUNTER — Ambulatory Visit (INDEPENDENT_AMBULATORY_CARE_PROVIDER_SITE_OTHER): Payer: BC Managed Care – PPO | Admitting: Vascular Surgery

## 2017-06-17 ENCOUNTER — Encounter (INDEPENDENT_AMBULATORY_CARE_PROVIDER_SITE_OTHER): Payer: Self-pay | Admitting: Vascular Surgery

## 2017-06-17 VITALS — BP 147/90 | HR 76 | Resp 16 | Wt 220.0 lb

## 2017-06-17 DIAGNOSIS — M7989 Other specified soft tissue disorders: Secondary | ICD-10-CM

## 2017-06-17 DIAGNOSIS — I890 Lymphedema, not elsewhere classified: Secondary | ICD-10-CM | POA: Diagnosis not present

## 2017-06-17 NOTE — Assessment & Plan Note (Signed)
Improved.  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  Continue compression stockings.  Continue to use the lymphedema pump regularly.

## 2017-06-17 NOTE — Progress Notes (Signed)
MRN : [id number]  Sharon Barnes is a 51 y.o. ([birth year]) female who presents with chief complaint of  Chief Complaint  Patient presents with  . Follow-up    3-104mo  .  History of Present Illness: Patient returns today in follow up of leg swelling.  She has lymphedema as well as some postphlebitic symptoms.  She continues to have a fair bit of pain in her right leg.  She does not have any new ulceration or infection.  She denies fever or chills.  Her swelling is significantly better.  She has been using the lymphedema pump 3 times a day.  She wears her compression stockings daily.         Current Outpatient Prescriptions  Medication Sig Dispense Refill  . aspirin EC 81 MG tablet Take by mouth.    . Calcium Carbonate-Vitamin D (CALCIUM-VITAMIN D) 500-200 MG-UNIT tablet Take by mouth.    . cetirizine (ZYRTEC) 10 MG tablet Take by mouth.    . clonazePAM (KLONOPIN) 0.5 MG tablet Take by mouth.    . Cyanocobalamin (RA VITAMIN B-12 TR) 1000 MCG TBCR Take by mouth.    . furosemide (LASIX) 20 MG tablet TAKE 1 TABLET (20 MG TOTAL) BY MOUTH ONCE DAILY AS NEEDED FOR EDEMA.  0  . Multiple Vitamin (MULTI-VITAMINS) TABS Take by mouth.    Marland Kitchen XIIDRA 5 % SOLN PLACE 1 DROP INTO BOTH EYES TWICE A DAY  5   No current facility-administered medications for this visit.         Past Medical History:  Diagnosis Date  . Hyperlipidemia   . Sleep apnea          Past Surgical History:  Procedure Laterality Date  . ABDOMINAL HYSTERECTOMY    . CHOLECYSTECTOMY    . NASAL SINUS SURGERY      Social History     Social History  Substance Use Topics  . Smoking status: Never Smoker  . Smokeless tobacco: Never Used  . Alcohol use Yes   No IV drug use  Family History      Family History  Problem Relation Age of Onset  . Heart disease Mother   . Cancer Father   . Heart disease Maternal Grandmother   . Cancer Maternal Grandfather   . Emphysema  Paternal Grandmother      No Known Allergies   REVIEW OF SYSTEMS(Negative unless checked)  Constitutional: [] Weight loss[] Fever[] Chills Cardiac:[] Chest pain[] Chest pressure[] Palpitations [] Shortness of breath when laying flat [] Shortness of breath at rest [] Shortness of breath with exertion. Vascular: [] Pain in legs with walking[x] Pain in legsat rest[] Pain in legs when laying flat [] Claudication [] Pain in feet when walking [] Pain in feet at rest [] Pain in feet when laying flat [] History of DVT [] Phlebitis [x] Swelling in legs [] Varicose veins [] Non-healing ulcers Pulmonary: [] Uses home oxygen [] Productive cough[] Hemoptysis [] Wheeze [] COPD [] Asthma Neurologic: [] Dizziness [] Blackouts [] Seizures [] History of stroke [] History of TIA[] Aphasia [] Temporary blindness[] Dysphagia [] Weaknessor numbness in arms [x] Weakness or numbnessin legs Musculoskeletal: [] Arthritis [] Joint swelling [] Joint pain [] Low back pain Hematologic:[] Easy bruising[] Easy bleeding [] Hypercoagulable state [] Anemic  Gastrointestinal:[] Blood in stool[] Vomiting blood[] Gastroesophageal reflux/heartburn[] Abdominal pain Genitourinary: [] Chronic kidney disease [] Difficulturination [] Frequenturination [] Burning with urination[] Hematuria Skin: [] Rashes [] Ulcers [] Wounds Psychological: [] History of anxiety[] History of major depression.    Physical Examination  BP (!) 147/90 (BP Location: Right Arm)   Pulse 76   Resp 16   Wt 99.8 kg (220 lb)   BMI 36.61 kg/m  Gen:  WD/WN, NAD Head: [place]/AT, No temporalis wasting. Ear/Nose/Throat: Hearing grossly intact, nares w/o erythema or  drainage, trachea midline Eyes: Conjunctiva clear. Sclera non-icteric Neck: Supple.  No JVD.  Pulmonary:  Good air movement, no use of accessory muscles.  Cardiac: RRR, normal S1, S2 Vascular:  Vessel Right Left  Radial Palpable  Palpable                                    Musculoskeletal: M/S 5/5 throughout.  No deformity or atrophy.  1+ bilateral lower extremity edema. Neurologic: Sensation grossly intact in extremities.  Symmetrical.  Speech is fluent.  Psychiatric: Judgment intact, Mood & affect appropriate for pt's clinical situation. Dermatologic: No rashes or ulcers noted.  No cellulitis or open wounds.       Labs No results found for this or any previous visit (from the past 2160 hour(s)).  Radiology US Carotid Bilateral  Result Date: 06/15/2017 CLINICAL DATA:  Postural dizziness.  Syncopal episode. EXAM: BILATERAL CAROTID DUPLEX ULTRASOUND TECHNIQUE: Pearline Cables scale imaging, color Doppler and duplex ultrasound were performed of bilateral carotid and vertebral arteries in the neck. COMPARISON:  None. FINDINGS: Examination is minimally degraded due to patient body habitus and poor sonographic window. Criteria: Quantification of carotid stenosis is based on velocity parameters that correlate the residual internal carotid diameter with NASCET-based stenosis levels, using the diameter of the distal internal carotid lumen as the denominator for stenosis measurement. The following velocity measurements were obtained: RIGHT ICA:  87/38 cm/sec CCA:  517/61 cm/sec SYSTOLIC ICA/CCA RATIO:  0.6 DIASTOLIC ICA/CCA RATIO:  1.2 ECA:  61 cm/sec LEFT ICA:  86/36 cm/sec CCA:  607/37 cm/sec SYSTOLIC ICA/CCA RATIO:  0.7 DIASTOLIC ICA/CCA RATIO:  1.3 ECA:  80 cm/sec RIGHT CAROTID ARTERY: There is no grayscale evidence of significant intimal thickening or atherosclerotic plaque affecting interrogated portions of the right carotid system. There are no elevated peak systolic velocities within the interrogated course the right internal carotid artery to suggest a hemodynamically significant stenosis. RIGHT VERTEBRAL ARTERY:  Antegrade flow LEFT CAROTID ARTERY: There is no grayscale evidence significant intimal thickening or  atherosclerotic plaque affecting interrogated portions of the left carotid system. There are no elevated peak systolic velocities within the interrogated course the left internal carotid artery to suggest a hemodynamically significant stenosis. LEFT VERTEBRAL ARTERY:  Antegrade flow IMPRESSION: Normal carotid Doppler ultrasound. Electronically Signed   By: Sandi Mariscal M.D.   On: 06/15/2017 17:04    Assessment/Plan  Swelling of limb Improved.  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  Continue compression stockings.  Continue to use the lymphedema pump regularly.  Lymphedema Improved.  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  Continue compression stockings.  Continue to use the lymphedema pump regularly.    Leotis Pain, MD  06/17/2017 5:06 PM    This note was created with Dragon medical transcription system.  Any errors from dictation are purely unintentional

## 2017-06-17 NOTE — Patient Instructions (Signed)

## 2018-06-20 ENCOUNTER — Encounter (INDEPENDENT_AMBULATORY_CARE_PROVIDER_SITE_OTHER): Payer: Self-pay | Admitting: Vascular Surgery

## 2018-06-20 ENCOUNTER — Ambulatory Visit (INDEPENDENT_AMBULATORY_CARE_PROVIDER_SITE_OTHER): Payer: BC Managed Care – PPO | Admitting: Vascular Surgery

## 2018-06-20 VITALS — BP 139/83 | HR 72 | Resp 16 | Ht 65.0 in | Wt 219.6 lb

## 2018-06-20 DIAGNOSIS — M79605 Pain in left leg: Secondary | ICD-10-CM

## 2018-06-20 DIAGNOSIS — I890 Lymphedema, not elsewhere classified: Secondary | ICD-10-CM | POA: Diagnosis not present

## 2018-06-20 DIAGNOSIS — M79604 Pain in right leg: Secondary | ICD-10-CM

## 2018-06-20 NOTE — Progress Notes (Signed)
Subjective:    Patient ID: Sharon Barnes, female    DOB: [birth year], 52 y.o.   MRN: [id number] Chief Complaint  Patient presents with  . Follow-up    34yr follow up   The patient presents for your lymphedema follow-up.  At this time, the patient's daily regimen is to use her lymphedema pump once a day towards the evening for an hour.  The patient is unable to wear medical grade 1 compression socks as she states her legs are "too sensitive".  The patient denies any claudication-like symptoms, rest pain or ulcer formation to the bilateral lower extremity.  The patient does experience some swelling however it seems to be relatively controlled with the use of her lymphedema pump.  The patient notes she does experience painful varicosities to the distal leg and feet.  She denies any recurrent bouts of cellulitis.  Patient denies any fever, nausea vomiting.  Review of Systems  Constitutional: Negative.   HENT: Negative.   Eyes: Negative.   Respiratory: Negative.   Cardiovascular: Positive for leg swelling.  Gastrointestinal: Negative.   Endocrine: Negative.   Genitourinary: Negative.   Musculoskeletal: Negative.   Skin: Negative.   Allergic/Immunologic: Negative.   Neurological: Negative.   Hematological: Negative.   Psychiatric/Behavioral: Negative.       Objective:   Physical Exam  Constitutional: She is oriented to person, place, and time. She appears well-developed and well-nourished. No distress.  HENT:  Head: Normocephalic and atraumatic.  Right Ear: External ear normal.  Left Ear: External ear normal.  Eyes: Pupils are equal, round, and reactive to light. Conjunctivae and EOM are normal.  Neck: Normal range of motion.  Cardiovascular: Normal rate, regular rhythm, normal heart sounds and intact distal pulses.  Pulses:      Radial pulses are 2+ on the right side, and 2+ on the left side.  Unable to palpate pedal pulses due to body habitus and edema however the bilateral  feet are warm.  Good capillary refill.  Pulmonary/Chest: Effort normal and breath sounds normal.  Musculoskeletal: Normal range of motion. She exhibits edema (Mild nonpitting edema noted bilaterally).  Neurological: She is alert and oriented to person, place, and time.  Skin: She is not diaphoretic.  Minimal less than 1 cm scattered varicosities noted to the bilateral legs.  There is no stasis dermatitis, fibrosis, cellulitis or active ulcerations noted at this time.  Psychiatric: She has a normal mood and affect. Her behavior is normal. Judgment and thought content normal.  Vitals reviewed.  BP 139/83 (BP Location: Right Arm)   Pulse 72   Resp 16   Ht 5\' 5"  (1.651 m)   Wt 219 lb 9.6 oz (99.6 kg)   BMI 36.54 kg/m   Past Medical History:  Diagnosis Date  . Hyperlipidemia   . Sleep apnea    Social History   Socioeconomic History  . Marital status: Married    Spouse name: Not on file  . Number of children: Not on file  . Years of education: Not on file  . Highest education level: Not on file  Occupational History  . Not on file  Social Needs  . Financial resource strain: Not on file  . Food insecurity:    Worry: Not on file    Inability: Not on file  . Transportation needs:    Medical: Not on file    Non-medical: Not on file  Tobacco Use  . Smoking status: Never Smoker  . Smokeless tobacco: Never Used  Substance and Sexual Activity  . Alcohol use: Yes  . Drug use: No  . Sexual activity: Not on file  Lifestyle  . Physical activity:    Days per week: Not on file    Minutes per session: Not on file  . Stress: Not on file  Relationships  . Social connections:    Talks on phone: Not on file    Gets together: Not on file    Attends religious service: Not on file    Active member of club or organization: Not on file    Attends meetings of clubs or organizations: Not on file    Relationship status: Not on file  . Intimate partner violence:    Fear of current or ex  partner: Not on file    Emotionally abused: Not on file    Physically abused: Not on file    Forced sexual activity: Not on file  Other Topics Concern  . Not on file  Social History Narrative  . Not on file   Past Surgical History:  Procedure Laterality Date  . ABDOMINAL HYSTERECTOMY    . CHOLECYSTECTOMY    . NASAL SINUS SURGERY     Family History  Problem Relation Age of Onset  . Heart disease Mother   . Cancer Father   . Heart disease Maternal Grandmother   . Cancer Maternal Grandfather   . Emphysema Paternal Grandmother    No Known Allergies     Assessment & Plan:  The patient presents for your lymphedema follow-up.  At this time, the patient's daily regimen is to use her lymphedema pump once a day towards the evening for an hour.  The patient is unable to wear medical grade 1 compression socks as she states her legs are "too sensitive".  The patient denies any claudication-like symptoms, rest pain or ulcer formation to the bilateral lower extremity.  The patient does experience some swelling however it seems to be relatively controlled with the use of her lymphedema pump.  The patient notes she does experience painful varicosities to the distal leg and feet.  She denies any recurrent bouts of cellulitis.  Patient denies any fever, nausea vomiting.  1. Lymphedema - Stable Strongly encouraged the patient to engage in conservative therapy including wearing medical grade one compression socks. Encourage the patient to find a material that would work with her sensitive skin and make sure that she was measured appropriately by a professional.   New prescription was given.   The patient was encouraged to elevate her legs heart level or higher during the day as much as possible.   The patient was also encouraged to use her lymphedema pump with her legs in the elevated position on a daily basis. She is to follow-up in one year for continued lymphedema surveillance The patient was  instructed to call the office in the interim if any worsening edema or ulcerations to the legs, feet or toes occurs. The patient expresses their understanding.  2. Pain in both lower extremities - Stable As above  Current Outpatient Medications on File Prior to Visit  Medication Sig Dispense Refill  . clonazePAM (KLONOPIN) 0.5 MG tablet Take by mouth as needed.     . Cyanocobalamin (RA VITAMIN B-12 TR) 1000 MCG TBCR Take by mouth.    . mometasone (NASONEX) 50 MCG/ACT nasal spray Place 2 sprays into the nose 2 (two) times daily.    . Multiple Vitamin (MULTI-VITAMINS) TABS Take by mouth.    Marland Kitchen aspirin  EC 81 MG tablet Take by mouth.    . Calcium Carbonate-Vitamin D (CALCIUM-VITAMIN D) 500-200 MG-UNIT tablet Take by mouth.    . cetirizine (ZYRTEC) 10 MG tablet Take by mouth.    . furosemide (LASIX) 20 MG tablet TAKE 1 TABLET (20 MG TOTAL) BY MOUTH ONCE DAILY AS NEEDED FOR EDEMA.  0  . XIIDRA 5 % SOLN PLACE 1 DROP INTO BOTH EYES TWICE A DAY  5   No current facility-administered medications on file prior to visit.    There are no Patient Instructions on file for this visit. No follow-ups on file.  Orean Giarratano A Cane Dubray, PA-C

## 2019-02-01 ENCOUNTER — Telehealth: Payer: BC Managed Care – PPO | Admitting: Physician Assistant

## 2019-02-01 DIAGNOSIS — L255 Unspecified contact dermatitis due to plants, except food: Secondary | ICD-10-CM | POA: Diagnosis not present

## 2019-02-01 MED ORDER — PREDNISONE 10 MG (21) PO TBPK
ORAL_TABLET | ORAL | 0 refills | Status: DC
Start: 2019-02-01 — End: 2019-07-17

## 2019-02-01 NOTE — Progress Notes (Signed)

## 2019-02-01 NOTE — Progress Notes (Signed)
I have spent 5 minutes in review of e-visit questionnaire, review and updating patient chart, medical decision making and response to patient.   Coralynn Gaona Cody Jemiah [place], PA-C    

## 2019-03-20 ENCOUNTER — Telehealth: Payer: Self-pay

## 2019-03-20 ENCOUNTER — Other Ambulatory Visit: Payer: Self-pay

## 2019-03-20 NOTE — Telephone Encounter (Signed)
Patient has been scheduled an office visit to discuss reflux and hemorrhoids with Dr. Marius Ditch.  Thanks [employer]

## 2019-05-02 ENCOUNTER — Ambulatory Visit: Payer: BC Managed Care – PPO | Admitting: Gastroenterology

## 2019-05-31 ENCOUNTER — Ambulatory Visit: Payer: BC Managed Care – PPO | Admitting: Gastroenterology

## 2019-06-26 ENCOUNTER — Encounter (INDEPENDENT_AMBULATORY_CARE_PROVIDER_SITE_OTHER): Payer: Self-pay | Admitting: Nurse Practitioner

## 2019-06-26 ENCOUNTER — Ambulatory Visit (INDEPENDENT_AMBULATORY_CARE_PROVIDER_SITE_OTHER): Payer: BC Managed Care – PPO | Admitting: Nurse Practitioner

## 2019-06-26 ENCOUNTER — Other Ambulatory Visit: Payer: Self-pay

## 2019-06-26 VITALS — BP 145/82 | HR 69 | Resp 16 | Wt 224.0 lb

## 2019-06-26 DIAGNOSIS — I890 Lymphedema, not elsewhere classified: Secondary | ICD-10-CM | POA: Diagnosis not present

## 2019-06-26 DIAGNOSIS — E785 Hyperlipidemia, unspecified: Secondary | ICD-10-CM | POA: Diagnosis not present

## 2019-06-26 DIAGNOSIS — M7989 Other specified soft tissue disorders: Secondary | ICD-10-CM | POA: Diagnosis not present

## 2019-06-26 NOTE — Progress Notes (Signed)
SUBJECTIVE:  Patient ID: Sharon Barnes, female    DOB: [birth year], 53 y.o.   MRN: [id number] Chief Complaint  Patient presents with  . Follow-up    HPI  Sharon Barnes is a 53 y.o. female The patient returns to the office for followup evaluation regarding leg swelling.  The swelling has persisted but with the lymph pump is much, much better controlled. The pain associated with swelling is essentially eliminated. There have not been any interval development of a ulcerations or wounds.  The patient denies problems with the pump, noting it is working well and the leggings are in good condition.  Since the previous visit the patient has been wearing graduated compression stockings and using the lymph pump on a routine basis and  has noted significant improvement in the lymphedema.   Patient stated the lymph pump has been a very positive factor in her care.    Past Medical History:  Diagnosis Date  . Hyperlipidemia   . Sleep apnea     Past Surgical History:  Procedure Laterality Date  . ABDOMINAL HYSTERECTOMY    . CHOLECYSTECTOMY    . NASAL SINUS SURGERY      Social History   Socioeconomic History  . Marital status: Divorced    Spouse name: Not on file  . Number of children: Not on file  . Years of education: Not on file  . Highest education level: Not on file  Occupational History  . Not on file  Social Needs  . Financial resource strain: Not on file  . Food insecurity    Worry: Not on file    Inability: Not on file  . Transportation needs    Medical: Not on file    Non-medical: Not on file  Tobacco Use  . Smoking status: Never Smoker  . Smokeless tobacco: Never Used  Substance and Sexual Activity  . Alcohol use: Yes  . Drug use: No  . Sexual activity: Not on file  Lifestyle  . Physical activity    Days per week: Not on file    Minutes per session: Not on file  . Stress: Not on file  Relationships  . Social Herbalist on phone: Not on  file    Gets together: Not on file    Attends religious service: Not on file    Active member of club or organization: Not on file    Attends meetings of clubs or organizations: Not on file    Relationship status: Not on file  . Intimate partner violence    Fear of current or ex partner: Not on file    Emotionally abused: Not on file    Physically abused: Not on file    Forced sexual activity: Not on file  Other Topics Concern  . Not on file  Social History Narrative  . Not on file    Family History  Problem Relation Age of Onset  . Heart disease Mother   . Cancer Father   . Heart disease Maternal Grandmother   . Cancer Maternal Grandfather   . Emphysema Paternal Grandmother     No Known Allergies   Review of Systems   Review of Systems: Negative Unless Checked Constitutional: [] Weight loss  [] Fever  [] Chills Cardiac: [] Chest pain   []  Atrial Fibrillation  [] Palpitations   [] Shortness of breath when laying flat   [] Shortness of breath with exertion. [] Shortness of breath at rest Vascular:  [] Pain in legs with walking   []   Pain in legs with standing [] Pain in legs when laying flat   [] Claudication    [] Pain in feet when laying flat    [x] History of DVT   [] Phlebitis   [x] Swelling in legs   [x] Varicose veins   [] Non-healing ulcers Pulmonary:   [] Uses home oxygen   [] Productive cough   [] Hemoptysis   [] Wheeze  [] COPD   [] Asthma Neurologic:  [] Dizziness   [] Seizures  [] Blackouts [] History of stroke   [] History of TIA  [] Aphasia   [] Temporary Blindness   [] Weakness or numbness in arm   [] Weakness or numbness in leg Musculoskeletal:   [] Joint swelling   [] Joint pain   [] Low back pain  []  History of Knee Replacement [x] Arthritis [] back Surgeries  []  Spinal Stenosis    Hematologic:  [] Easy bruising  [] Easy bleeding   [] Hypercoagulable state   [] Anemic Gastrointestinal:  [] Diarrhea   [] Vomiting  [] Gastroesophageal reflux/heartburn   [] Difficulty swallowing. [] Abdominal pain  Genitourinary:  [] Chronic kidney disease   [] Difficult urination  [] Anuric   [] Blood in urine [] Frequent urination  [] Burning with urination   [] Hematuria Skin:  [] Rashes   [] Ulcers [] Wounds Psychological:  [] History of anxiety   []  History of major depression  []  Memory Difficulties      OBJECTIVE:   Physical Exam  BP (!) 145/82 (BP Location: Right Arm)   Pulse 69   Resp 16   Wt 224 lb (101.6 kg)   BMI 37.28 kg/m   Gen: WD/WN, NAD Head: [place]/AT, No temporalis wasting.  Ear/Nose/Throat: Hearing grossly intact, nares w/o erythema or drainage Eyes: PER, EOMI, sclera nonicteric.  Neck: Supple, no masses.  No JVD.  Pulmonary:  Good air movement, no use of accessory muscles.  Cardiac: RRR Vascular: 2+ soft edema near ankles Vessel Right Left  Radial Palpable Palpable  Dorsalis Pedis Palpable Palpable  Posterior Tibial Palpable Palpable   Gastrointestinal: soft, non-distended. No guarding/no peritoneal signs.  Musculoskeletal: M/S 5/5 throughout.  No deformity or atrophy.  Neurologic: Pain and light touch intact in extremities.  Symmetrical.  Speech is fluent. Motor exam as listed above. Psychiatric: Judgment intact, Mood & affect appropriate for pt's clinical situation. Dermatologic: mild stasis dermatitis bilaterally. No Ulcers Noted.  No changes consistent with cellulitis. Lymph : No Cervical lymphadenopathy, dermal thickening        ASSESSMENT AND PLAN:  1. Lymphedema  No surgery or intervention at this point in time.    I have reviewed my discussion with the patient regarding lymphedema and why it  causes symptoms.  Patient will continue wearing graduated compression stockings class 1 (20-30 mmHg) on a daily basis a prescription was given. The patient is reminded to put the stockings on first thing in the morning and removing them in the evening. The patient is instructed specifically not to sleep in the stockings.   In addition, behavioral modification throughout the day  will be continued.  This will include frequent elevation (such as in a recliner), use of over the counter pain medications as needed and exercise such as walking.  I have reviewed systemic causes for chronic edema such as liver, kidney and cardiac etiologies and there does not appear to be any significant changes in these organ systems over the past year.  The patient is under the impression that these organ systems are all stable and unchanged.    The patient will continue aggressive use of the  lymph pump.  This will continue to improve the edema control and prevent sequela such as ulcers and  infections.   The patient will follow-up with me on an annual basis.    2. Hyperlipidemia, unspecified hyperlipidemia type Continue statin as ordered and reviewed, no changes at this time   3. Swelling of limb The patient has swelling of the left upper extremity as well.  Not terribly bothersome but she is advised to try compression sleeves to see if that will assist with decreasing the swelling.     Current Outpatient Medications on File Prior to Visit  Medication Sig Dispense Refill  . clonazePAM (KLONOPIN) 0.5 MG tablet Take by mouth as needed.     . Cyanocobalamin (RA VITAMIN B-12 TR) 1000 MCG TBCR Take by mouth.    . gabapentin (NEURONTIN) 100 MG capsule Take 100 mg by mouth 3 (three) times daily.    . mometasone (NASONEX) 50 MCG/ACT nasal spray Place 2 sprays into the nose 2 (two) times daily.    . Multiple Vitamin (MULTI-VITAMINS) TABS Take by mouth.    Marland Kitchen aspirin EC 81 MG tablet Take by mouth.    . Calcium Carbonate-Vitamin D (CALCIUM-VITAMIN D) 500-200 MG-UNIT tablet Take by mouth.    . cetirizine (ZYRTEC) 10 MG tablet Take by mouth.    . furosemide (LASIX) 20 MG tablet TAKE 1 TABLET (20 MG TOTAL) BY MOUTH ONCE DAILY AS NEEDED FOR EDEMA.  0  . predniSONE (STERAPRED UNI-PAK 21 TAB) 10 MG (21) TBPK tablet Take following package directions (Patient not taking: Reported on 06/26/2019) 21 tablet  0  . traMADol (ULTRAM) 50 MG tablet Take 50 mg by mouth every 6 (six) hours as needed.    Marland Kitchen XIIDRA 5 % SOLN PLACE 1 DROP INTO BOTH EYES TWICE A DAY  5   No current facility-administered medications on file prior to visit.     There are no Patient Instructions on file for this visit. No follow-ups on file.   Kris Hartmann, NP  This note was completed with Sales executive.  Any errors are purely unintentional.

## 2019-07-12 ENCOUNTER — Other Ambulatory Visit: Payer: Self-pay | Admitting: Orthopedic Surgery

## 2019-07-17 ENCOUNTER — Encounter: Payer: Self-pay | Admitting: *Deleted

## 2019-07-17 ENCOUNTER — Other Ambulatory Visit: Payer: Self-pay

## 2019-07-20 ENCOUNTER — Other Ambulatory Visit: Payer: Self-pay

## 2019-07-20 ENCOUNTER — Other Ambulatory Visit
Admission: RE | Admit: 2019-07-20 | Discharge: 2019-07-20 | Disposition: A | Discharge status: Home / Self Care | Payer: BC Managed Care – PPO | Source: Ambulatory Visit | Attending: Orthopedic Surgery | Admitting: Orthopedic Surgery

## 2019-07-20 DIAGNOSIS — Z01812 Encounter for preprocedural laboratory examination: Secondary | ICD-10-CM | POA: Insufficient documentation

## 2019-07-20 DIAGNOSIS — Z20828 Contact with and (suspected) exposure to other viral communicable diseases: Secondary | ICD-10-CM | POA: Diagnosis not present

## 2019-07-21 LAB — SARS CORONAVIRUS 2 (TAT 6-24 HRS): SARS Coronavirus 2: NEGATIVE

## 2019-07-24 ENCOUNTER — Encounter
Admission: RE | Disposition: A | Discharge status: Home / Self Care | Payer: Self-pay | Source: Ambulatory Visit | Attending: Orthopedic Surgery

## 2019-07-24 ENCOUNTER — Other Ambulatory Visit: Payer: Self-pay

## 2019-07-24 ENCOUNTER — Ambulatory Visit: Payer: BC Managed Care – PPO | Admitting: Anesthesiology

## 2019-07-24 ENCOUNTER — Ambulatory Visit
Admission: RE | Admit: 2019-07-24 | Discharge: 2019-07-24 | Disposition: A | Discharge status: Home / Self Care | Payer: BC Managed Care – PPO | Source: Ambulatory Visit | Attending: Orthopedic Surgery | Admitting: Orthopedic Surgery

## 2019-07-24 DIAGNOSIS — E669 Obesity, unspecified: Secondary | ICD-10-CM | POA: Insufficient documentation

## 2019-07-24 DIAGNOSIS — F419 Anxiety disorder, unspecified: Secondary | ICD-10-CM | POA: Diagnosis not present

## 2019-07-24 DIAGNOSIS — Z6837 Body mass index (BMI) 37.0-37.9, adult: Secondary | ICD-10-CM | POA: Diagnosis not present

## 2019-07-24 DIAGNOSIS — Z79899 Other long term (current) drug therapy: Secondary | ICD-10-CM | POA: Insufficient documentation

## 2019-07-24 DIAGNOSIS — K219 Gastro-esophageal reflux disease without esophagitis: Secondary | ICD-10-CM | POA: Diagnosis not present

## 2019-07-24 DIAGNOSIS — S83241A Other tear of medial meniscus, current injury, right knee, initial encounter: Secondary | ICD-10-CM | POA: Diagnosis present

## 2019-07-24 DIAGNOSIS — X58XXXA Exposure to other specified factors, initial encounter: Secondary | ICD-10-CM | POA: Insufficient documentation

## 2019-07-24 DIAGNOSIS — M1711 Unilateral primary osteoarthritis, right knee: Secondary | ICD-10-CM | POA: Diagnosis not present

## 2019-07-24 DIAGNOSIS — M6751 Plica syndrome, right knee: Secondary | ICD-10-CM | POA: Diagnosis not present

## 2019-07-24 HISTORY — PX: KNEE ARTHROSCOPY WITH MENISCAL REPAIR: SHX5653

## 2019-07-24 HISTORY — DX: Nausea with vomiting, unspecified: R11.2

## 2019-07-24 HISTORY — DX: Unspecified osteoarthritis, unspecified site: M19.90

## 2019-07-24 HISTORY — DX: Nausea with vomiting, unspecified: Z98.890

## 2019-07-24 HISTORY — DX: Gastro-esophageal reflux disease without esophagitis: K21.9

## 2019-07-24 HISTORY — DX: Motion sickness, initial encounter: T75.3XXA

## 2019-07-24 HISTORY — DX: Anesthesia of skin: R20.0

## 2019-07-24 HISTORY — DX: Lymphedema, not elsewhere classified: I89.0

## 2019-07-24 SURGERY — ARTHROSCOPY, KNEE, WITH MENISCUS REPAIR
Anesthesia: General | Site: Knee | Laterality: Right

## 2019-07-24 MED ORDER — MEPERIDINE HCL 25 MG/ML IJ SOLN: 6.2500 mg | INTRAMUSCULAR | Status: DC | PRN

## 2019-07-24 MED ORDER — LIDOCAINE-EPINEPHRINE 1 %-1:100000 IJ SOLN
INTRAMUSCULAR | Status: DC | PRN
  Administered 2019-07-24: 8 mL

## 2019-07-24 MED ORDER — OXYCODONE HCL 5 MG/5ML PO SOLN: 5.0000 mg | Freq: Once | ORAL | Status: DC | PRN

## 2019-07-24 MED ORDER — HYDROMORPHONE HCL 1 MG/ML IJ SOLN
0.2500 mg | INTRAMUSCULAR | Status: DC | PRN
  Administered 2019-07-24 (×2): 0.25 mg via INTRAVENOUS

## 2019-07-24 MED ORDER — MIDAZOLAM HCL 5 MG/5ML IJ SOLN
INTRAMUSCULAR | Status: DC | PRN
  Administered 2019-07-24: 2 mg via INTRAVENOUS

## 2019-07-24 MED ORDER — PROMETHAZINE HCL 25 MG/ML IJ SOLN: 6.2500 mg | INTRAMUSCULAR | Status: DC | PRN

## 2019-07-24 MED ORDER — DEXAMETHASONE SODIUM PHOSPHATE 4 MG/ML IJ SOLN
INTRAMUSCULAR | Status: DC | PRN
  Administered 2019-07-24: 4 mg via INTRAVENOUS

## 2019-07-24 MED ORDER — OXYCODONE HCL 5 MG PO TABS: 5.0000 mg | ORAL_TABLET | Freq: Once | ORAL | Status: DC | PRN

## 2019-07-24 MED ORDER — ONDANSETRON 4 MG PO TBDP: 4.0000 mg | ORAL_TABLET | Freq: Three times a day (TID) | ORAL | 0 refills | Status: DC | PRN

## 2019-07-24 MED ORDER — PROPOFOL 10 MG/ML IV BOLUS
INTRAVENOUS | Status: DC | PRN
  Administered 2019-07-24: 150 mg via INTRAVENOUS
  Administered 2019-07-24: 50 mg via INTRAVENOUS

## 2019-07-24 MED ORDER — FENTANYL CITRATE (PF) 100 MCG/2ML IJ SOLN
INTRAMUSCULAR | Status: DC | PRN
  Administered 2019-07-24 (×4): 25 ug via INTRAVENOUS

## 2019-07-24 MED ORDER — LACTATED RINGERS IV SOLN
100.0000 mL/h | INTRAVENOUS | Status: DC
  Administered 2019-07-24: 100 mL/h via INTRAVENOUS

## 2019-07-24 MED ORDER — SCOPOLAMINE 1 MG/3DAYS TD PT72
1.0000 | MEDICATED_PATCH | TRANSDERMAL | Status: DC
  Administered 2019-07-24: 1.5 mg via TRANSDERMAL

## 2019-07-24 MED ORDER — ONDANSETRON HCL 4 MG/2ML IJ SOLN
INTRAMUSCULAR | Status: DC | PRN
  Administered 2019-07-24: 4 mg via INTRAVENOUS

## 2019-07-24 MED ORDER — ACETAMINOPHEN 500 MG PO TABS: 1000.0000 mg | ORAL_TABLET | Freq: Three times a day (TID) | ORAL | 2 refills | Status: DC

## 2019-07-24 MED ORDER — CHLORHEXIDINE GLUCONATE 4 % EX LIQD
60.0000 mL | Freq: Once | CUTANEOUS | Status: AC
  Administered 2019-07-24: 4 via TOPICAL

## 2019-07-24 MED ORDER — IBUPROFEN 800 MG PO TABS: 800.0000 mg | ORAL_TABLET | Freq: Three times a day (TID) | ORAL | 1 refills | Status: AC

## 2019-07-24 MED ORDER — ASPIRIN EC 325 MG PO TBEC: 325.0000 mg | DELAYED_RELEASE_TABLET | Freq: Every day | ORAL | 0 refills | Status: AC

## 2019-07-24 MED ORDER — CEFAZOLIN SODIUM-DEXTROSE 2-4 GM/100ML-% IV SOLN
2.0000 g | INTRAVENOUS | Status: AC
  Administered 2019-07-24: 2 g via INTRAVENOUS

## 2019-07-24 MED ORDER — LIDOCAINE HCL (CARDIAC) PF 100 MG/5ML IV SOSY
PREFILLED_SYRINGE | INTRAVENOUS | Status: DC | PRN
  Administered 2019-07-24: 30 mg via INTRATRACHEAL

## 2019-07-24 MED ORDER — HYDROCODONE-ACETAMINOPHEN 5-325 MG PO TABS: 1.0000 | ORAL_TABLET | ORAL | 0 refills | Status: DC | PRN

## 2019-07-24 MED ORDER — BUPIVACAINE HCL 0.5 % IJ SOLN
INTRAMUSCULAR | Status: DC | PRN
  Administered 2019-07-24: 8 mL

## 2019-07-24 SURGICAL SUPPLY — 36 items
ADAPTER IRRIG TUBE 2 SPIKE SOL (ADAPTER) ×4 IMPLANT
BLADE SURG SZ11 CARB STEEL (BLADE) ×2 IMPLANT
BNDG COHESIVE 4X5 TAN STRL (GAUZE/BANDAGES/DRESSINGS) ×2 IMPLANT
BNDG ELASTIC 6X5.8 VLCR STR LF (GAUZE/BANDAGES/DRESSINGS) ×2 IMPLANT
BNDG ESMARK 6X12 TAN STRL LF (GAUZE/BANDAGES/DRESSINGS) ×2 IMPLANT
BUR RADIUS 3.5 (BURR) ×2 IMPLANT
CHLORAPREP W/TINT 26 (MISCELLANEOUS) ×2 IMPLANT
COOLER POLAR GLACIER W/PUMP (MISCELLANEOUS) ×2 IMPLANT
COVER LIGHT HANDLE UNIVERSAL (MISCELLANEOUS) ×4 IMPLANT
CUFF TOURN SGL QUICK 30 (TOURNIQUET CUFF) ×1
CUFF TRNQT CYL 30X4X21-28X (TOURNIQUET CUFF) ×1 IMPLANT
DRAPE IMP U-DRAPE 54X76 (DRAPES) ×2 IMPLANT
DRAPE U-SHAPE 48X52 POLY STRL (PACKS) ×2 IMPLANT
GAUZE SPONGE 4X4 12PLY STRL (GAUZE/BANDAGES/DRESSINGS) ×2 IMPLANT
GLOVE BIO SURGEON STRL SZ7.5 (GLOVE) ×2 IMPLANT
GLOVE BIOGEL PI IND STRL 8 (GLOVE) ×1 IMPLANT
GLOVE BIOGEL PI INDICATOR 8 (GLOVE) ×1
GOWN STRL REUS W/ TWL LRG LVL3 (GOWN DISPOSABLE) ×1 IMPLANT
GOWN STRL REUS W/TWL LRG LVL3 (GOWN DISPOSABLE) ×1
IV LACTATED RINGER IRRG 3000ML (IV SOLUTION) ×6
IV LR IRRIG 3000ML ARTHROMATIC (IV SOLUTION) ×6 IMPLANT
KIT TURNOVER KIT A (KITS) ×2 IMPLANT
MANIFOLD 4PT FOR NEPTUNE1 (MISCELLANEOUS) ×2 IMPLANT
MAT ABSORB  FLUID 56X50 GRAY (MISCELLANEOUS) ×1
MAT ABSORB FLUID 56X50 GRAY (MISCELLANEOUS) ×1 IMPLANT
PACK ARTHROSCOPY KNEE (MISCELLANEOUS) ×2 IMPLANT
PAD WRAPON POLAR KNEE (MISCELLANEOUS) ×1 IMPLANT
PADDING CAST BLEND 6X4 STRL (MISCELLANEOUS) ×1 IMPLANT
PADDING STRL CAST 6IN (MISCELLANEOUS) ×1
SET TUBE SUCT SHAVER OUTFL 24K (TUBING) ×2 IMPLANT
SET TUBE TIP INTRA-ARTICULAR (MISCELLANEOUS) ×2 IMPLANT
SUT ETHILON 3-0 FS-10 30 BLK (SUTURE) ×2
SUTURE EHLN 3-0 FS-10 30 BLK (SUTURE) ×1 IMPLANT
TUBING ARTHRO INFLOW-ONLY STRL (TUBING) ×2 IMPLANT
WAND WEREWOLF FLOW 90D (MISCELLANEOUS) ×2 IMPLANT
WRAPON POLAR PAD KNEE (MISCELLANEOUS) ×2

## 2019-07-24 NOTE — H&P (Signed)
Paper H&P to be scanned into permanent record. H&P reviewed. No significant changes noted.  

## 2019-07-24 NOTE — Discharge Instructions (Signed)
Arthroscopic Knee Surgery - Partial Meniscectomy   Post-Op Instructions   1. Bracing or crutches: Crutches will be provided at the time of discharge from the surgery center if you do not already have them.   2. Ice: You may be provided with a device [facility]) that allows you to ice the affected area effectively. Otherwise you can ice manually.    3. Driving:  Plan on not driving for at least two weeks. Please note that you are advised NOT to drive while taking narcotic pain medications as you may be impaired and unsafe to drive.   4. Activity: Ankle pumps several times an hour while awake to prevent blood clots. Weight bearing: as tolerated. Use crutches for as needed (usually ~1 week or less) until pain allows you to ambulate without a limp. Bending and straightening the knee is unlimited. Elevate knee above heart level as much as possible for one week. Avoid standing more than 5 minutes (consecutively) for the first week.  Avoid long distance travel for 2 weeks.  5. Medications:  - You have been provided a prescription for narcotic pain medicine. After surgery, take 1-2 narcotic tablets every 4 hours if needed for severe pain.  - You may take up to 3000mg /day of tylenol (acetaminophen). You can take 1000mg  3x/day. Please check your narcotic. If you have acetaminophen in your narcotic (each tablet will be 325mg ), be careful not to exceed a total of 3000mg /day of acetaminophen.  - A prescription for anti-nausea medication will be provided in case the narcotic medicine or anesthesia causes nausea - take 1 tablet every 6 hours only if nauseated.  - Take ibuprofen 800 mg every 8 hours WITH food to reduce post-operative knee swelling. DO NOT STOP IBUPROFEN POST-OP UNTIL INSTRUCTED TO DO SO at first post-op office visit (10-14 days after surgery). However, please discontinue if you have any abdominal discomfort after taking this.  - Take enteric coated aspirin 325 mg once daily for 2 weeks to prevent  blood clots.    6. Bandages: The physical therapist should change the bandages at the first post-op appointment. If needed, the dressing supplies have been provided to you.   7. Physical Therapy: 1-2 times per week for 6 weeks. Therapy typically starts on post operative Day 3 or 4. You have been provided an order for physical therapy. The therapist will provide home exercises.   8. Work: May return to full work usually around 2 weeks after 1st post-operative visit. May do light duty/desk job in approximately 1-2 weeks when off of narcotics, pain is well-controlled, and swelling has decreased. Labor intensive jobs may require 4-6 weeks to return.      9. Post-Op Appointments: Your first post-op appointment will be with Dr. Posey Pronto in approximately 2 weeks time.    If you find that they have not been scheduled please call the [facility]opaedic App[facility]ment front desk at [phone].    Scopolamine skin patches REMOVE PATCH IN 72 HOURS AND [place] HANDS IMMEDIATELY What is this medicine? SCOPOLAMINE (skoe POL a meen) is used to prevent nausea and vomiting caused by motion sickness, anesthesia and surgery. This medicine may be used for other purposes; ask your health care provider or pharmacist if you have questions. COMMON BRAND NAME(S): Transderm Scop What should I tell my health care provider before I take this medicine? They need to know if you have any of these conditions:  are scheduled to have a gastric secretion test  glaucoma  heart disease  kidney disease  liver disease  lung or breathing disease, like asthma  mental illness  prostate disease  seizures  stomach or intestine problems  trouble passing urine  an unusual or allergic reaction to scopolamine, atropine, other medicines, foods, dyes, or preservatives  pregnant or trying to get pregnant  breast-feeding How should I use this medicine? This medicine is for external use only. Follow the directions on the  prescription label. Wear only 1 patch at a time. Choose an area behind the ear, that is clean, dry, hairless and free from any cuts or irritation. Wipe the area with a clean dry tissue. Peel off the plastic backing of the skin patch, trying not to touch the adhesive side with your hands. Do not cut the patches. Firmly apply to the area you have chosen, with the metallic side of the patch to the skin and the tan-colored side showing. Once firmly in place, wash your hands well with soap and water. Do not get this medicine into your eyes. After removing the patch, wash your hands and the area behind your ear thoroughly with soap and water. The patch will still contain some medicine after use. To avoid accidental contact or ingestion by children or pets, fold the used patch in half with the sticky side together and throw away in the trash out of the reach of children and pets. If you need to use a second patch after you remove the first, place it behind the other ear. A special MedGuide will be given to you by the pharmacist with each prescription and refill. Be sure to read this information carefully each time. Talk to your pediatrician regarding the use of this medicine in children. Special care may be needed. Overdosage: If you think you have taken too much of this medicine contact a poison control center or emergency room at once. NOTE: This medicine is only for you. Do not share this medicine with others. What if I miss a dose? This does not apply. This medicine is not for regular use. What may interact with this medicine?  alcohol  antihistamines for allergy cough and cold  atropine  certain medicines for anxiety or sleep  certain medicines for bladder problems like oxybutynin, tolterodine  certain medicines for depression like amitriptyline, fluoxetine, sertraline  certain medicines for stomach problems like dicyclomine, hyoscyamine  certain medicines for Parkinson's disease like  benztropine, trihexyphenidyl  certain medicines for seizures like phenobarbital, primidone  general anesthetics like halothane, isoflurane, methoxyflurane, propofol  ipratropium  local anesthetics like lidocaine, pramoxine, tetracaine  medicines that relax muscles for surgery  phenothiazines like chlorpromazine, mesoridazine, prochlorperazine, thioridazine  narcotic medicines for pain  other belladonna alkaloids This list may not describe all possible interactions. Give your health care provider a list of all the medicines, herbs, non-prescription drugs, or dietary supplements you use. Also tell them if you smoke, drink alcohol, or use illegal drugs. Some items may interact with your medicine. What should I watch for while using this medicine? Limit contact with water while swimming and bathing because the patch may fall off. If the patch falls off, throw it away and put a new one behind the other ear. You may get drowsy or dizzy. Do not drive, use machinery, or do anything that needs mental alertness until you know how this medicine affects you. Do not stand or sit up quickly, especially if you are an older patient. This reduces the risk of dizzy or fainting spells. Alcohol may interfere with the effect of this medicine.  Avoid alcoholic drinks. Your mouth may get dry. Chewing sugarless gum or sucking hard candy, and drinking plenty of water may help. Contact your healthcare professional if the problem does not go away or is severe. This medicine may cause dry eyes and blurred vision. If you wear contact lenses, you may feel some discomfort. Lubricating drops may help. See your healthcare professional if the problem does not go away or is severe. If you are going to need surgery, an MRI, CT scan, or other procedure, tell your healthcare professional that you are using this medicine. You may need to remove the patch before the procedure. What side effects may I notice from receiving this  medicine? Side effects that you should report to your doctor or health care professional as soon as possible:  allergic reactions like skin rash, itching or hives; swelling of the face, lips, or tongue  blurred vision  changes in vision  confusion  dizziness  eye pain  fast, irregular heartbeat  hallucinations, loss of contact with reality  nausea, vomiting  pain or trouble passing urine  restlessness  seizures  skin irritation  stomach pain Side effects that usually do not require medical attention (report to your doctor or health care professional if they continue or are bothersome):  drowsiness  dry mouth  headache  sore throat This list may not describe all possible side effects. Call your doctor for medical advice about side effects. You may report side effects to FDA at 1-800-FDA-1088. Where should I keep my medicine? Keep out of the reach of children. Store at room temperature between 20 and 25 degrees C (68 and 77 degrees F). Keep this medicine in the foil package until ready to use. Throw away any unused medicine after the expiration date. NOTE: This sheet is a summary. It may not cover all possible information. If you have questions about this medicine, talk to your doctor, pharmacist, or health care provider.  2020 Elsevier/Gold Standard (2017-10-28 16:14:46)   General Anesthesia, Adult, Care After This sheet gives you information about how to care for yourself after your procedure. Your health care provider may also give you more specific instructions. If you have problems or questions, contact your health care provider. What can I expect after the procedure? After the procedure, the following side effects are common:  Pain or discomfort at the IV site.  Nausea.  Vomiting.  Sore throat.  Trouble concentrating.  Feeling cold or chills.  Weak or tired.  Sleepiness and fatigue.  Soreness and body aches. These side effects can affect parts  of the body that were not involved in surgery. Follow these instructions at home:  For at least 24 hours after the procedure:  Have a responsible adult stay with you. It is important to have someone help care for you until you are awake and alert.  Rest as needed.  Do not: ? Participate in activities in which you could fall or become injured. ? Drive. ? Use heavy machinery. ? Drink alcohol. ? Take sleeping pills or medicines that cause drowsiness. ? Make important decisions or sign legal documents. ? Take care of children on your own. Eating and drinking  Follow any instructions from your health care provider about eating or drinking restrictions.  When you feel hungry, start by eating small amounts of foods that are soft and easy to digest (bland), such as toast. Gradually return to your regular diet.  Drink enough fluid to keep your urine pale yellow.  If you vomit,  rehydrate by drinking water, juice, or clear broth. General instructions  If you have sleep apnea, surgery and certain medicines can increase your risk for breathing problems. Follow instructions from your health care provider about wearing your sleep device: ? Anytime you are sleeping, including during daytime naps. ? While taking prescription pain medicines, sleeping medicines, or medicines that make you drowsy.  Return to your normal activities as told by your health care provider. Ask your health care provider what activities are safe for you.  Take over-the-counter and prescription medicines only as told by your health care provider.  If you smoke, do not smoke without supervision.  Keep all follow-up visits as told by your health care provider. This is important. Contact a health care provider if:  You have nausea or vomiting that does not get better with medicine.  You cannot eat or drink without vomiting.  You have pain that does not get better with medicine.  You are unable to pass urine.  You  develop a skin rash.  You have a fever.  You have redness around your IV site that gets worse. Get help right away if:  You have difficulty breathing.  You have chest pain.  You have blood in your urine or stool, or you vomit blood. Summary  After the procedure, it is common to have a sore throat or nausea. It is also common to feel tired.  Have a responsible adult stay with you for the first 24 hours after general anesthesia. It is important to have someone help care for you until you are awake and alert.  When you feel hungry, start by eating small amounts of foods that are soft and easy to digest (bland), such as toast. Gradually return to your regular diet.  Drink enough fluid to keep your urine pale yellow.  Return to your normal activities as told by your health care provider. Ask your health care provider what activities are safe for you. This information is not intended to replace advice given to you by your health care provider. Make sure you discuss any questions you have with your health care provider. Document Released: 11/15/2000 Document Revised: 08/12/2017 Document Reviewed: 03/25/2017 Elsevier Patient Education  2020 [employer].

## 2019-07-24 NOTE — Anesthesia Postprocedure Evaluation (Signed)
Anesthesia Post Note  Patient: Sharon Barnes  Procedure(s) Performed: KNEE ARTHROSCOPY WITH PARTIAL MENISECTOMY CHONDROPLASTY, PATELLOFEMORAL COMPARTMENT, Plica Removal (Right Knee)     Patient location during evaluation: PACU Anesthesia Type: General Level of consciousness: awake and alert Pain management: pain level controlled Vital Signs Assessment: post-procedure vital signs reviewed and stable Respiratory status: spontaneous breathing, nonlabored ventilation, respiratory function stable and patient connected to nasal cannula oxygen Cardiovascular status: blood pressure returned to baseline and stable Postop Assessment: no apparent nausea or vomiting Anesthetic complications: no    [name] A  [name]

## 2019-07-24 NOTE — Anesthesia Procedure Notes (Signed)
Procedure Name: LMA Insertion Date/Time: 07/24/2019 12:59 PM Performed by: Cameron Ali, CRNA Pre-anesthesia Checklist: Patient identified, Emergency Drugs available, Suction available, Timeout performed and Patient being monitored Patient Re-evaluated:Patient Re-evaluated prior to induction Oxygen Delivery Method: Circle system utilized Preoxygenation: Pre-oxygenation with 100% oxygen Induction Type: IV induction LMA: LMA inserted LMA Size: 3.0 Number of attempts: 1 Placement Confirmation: positive ETCO2 and breath sounds checked- equal and bilateral Tube secured with: Tape Dental Injury: Teeth and Oropharynx as per pre-operative assessment

## 2019-07-24 NOTE — Transfer of Care (Signed)
Immediate Anesthesia Transfer of Care Note  Patient: Sharon Barnes  Procedure(s) Performed: KNEE ARTHROSCOPY WITH PARTIAL MENISECTOMY CHONDROPLASTY, PATELLOFEMORAL COMPARTMENT, Plica Removal (Right Knee)  Patient Location: PACU  Anesthesia Type: General  Level of Consciousness: awake, alert  and patient cooperative  Airway and Oxygen Therapy: Patient Spontanous Breathing and Patient connected to supplemental oxygen  Post-op Assessment: Post-op Vital signs reviewed, Patient's Cardiovascular Status Stable, Respiratory Function Stable, Patent Airway and No signs of Nausea or vomiting  Post-op Vital Signs: Reviewed and stable  Complications: No apparent anesthesia complications

## 2019-07-24 NOTE — Op Note (Signed)
Operative Note    SURGERY DATE: 07/24/2019   PRE-OP DIAGNOSIS:  1. Right knee medial meniscus tear 2. Right knee patellofemoral degenerative changes   POST-OP DIAGNOSIS:  1. Right knee medial meniscus tear 2. Right knee patellofemoral degenerative changes 3. Right knee medial plica   PROCEDURES:  1. Right knee arthroscopy, partial medial meniscectomy 2. Right knee chondroplasty of patellofemoral compartment 3. Right knee partial synovectomy with medial plica excision   SURGEON: Cato Mulligan, MD   ANESTHESIA: Gen   ESTIMATED BLOOD LOSS: minimal   TOTAL IV FLUIDS: per anesthesia   INDICATION(S):  Sharon Barnes is a 53 y.o. female with signs and symptoms as well as MRI finding of a full width posterior horn medial meniscus tear adjacent to the level of the meniscus root.  She had failed conservative management including corticosteroid injection, activity modifications, and medical management.  I had previously discussed with the patient of potentially performing a repair if this was repairable tear near the root and not too far medial into the posterior horn.  The patient has been in agreement with this plan.  However, prior to surgery, the patient elected to not proceed with a repair.  She did not wish to undergo the associated rehab with this.  Additionally, she understands the significant risks of progressive degenerative changes and/or incomplete or recurrent pain after this procedure without meniscus repair.  After discussion of risks, benefits, and alternatives to surgery, the patient elected to proceed.   OPERATIVE FINDINGS:    Examination under anesthesia: A careful examination under anesthesia was performed.  Passive range of motion was: Hyperextension: 1.  Extension: 0.  Flexion: 130.  Lachman: normal. Pivot Shift: normal.  Posterior drawer: normal.  Varus stability in full extension: normal.  Varus stability in 30 degrees of flexion: normal.  Valgus stability in full  extension: normal.  Valgus stability in 30 degrees of flexion: normal.   Intra-operative findings: A thorough arthroscopic examination of the knee was performed.  The findings are: 1. Suprapatellar pouch: Normal 2. Undersurface of median ridge:  Focal grade 4 defect measuring approximately 10 x 10 mm 3. Medial patellar facet: Grade 1 softening 4. Lateral patellar facet: Grade 1 softening 5. Trochlea: Focal area of medial trochlea with approximately 8 mm x 4 mm grade 4 defect with grade 3-4 changes to the central trochlea spanning almost the entirety of the central trochlea. 6. Lateral gutter/popliteus tendon: Normal 7. Hoffa's fat pad: Inflamed 8. Medial gutter/plica: Significant medial plica band present 9. ACL: Normal 10. PCL: Normal 11. Medial meniscus: Complete tear of the medial meniscus posterior horn root 12. Medial compartment cartilage: Grade 1 degenerative changes the medial tibial plateau 13. Lateral meniscus: Normal 14. Lateral compartment cartilage:  Focal area of grade 2-3 degenerative changes of the lateral femoral condyle measuring approximately 12 x 4 mm.  Grade 1 changes to the tibial plateau   OPERATIVE REPORT:     I identified Sharon Barnes in the pre-operative holding area. I marked the operative knee with my initials. I reviewed the risks and benefits of the proposed surgical intervention and the patient (and/or patient's guardian) wished to proceed. The patient was transferred to the operative suite and placed in the supine position with all bony prominences padded.  Anesthesia was administered. Appropriate IV antibiotics were administered prior to incision. The extremity was then prepped and draped in standard fashion. A time out was performed confirming the correct extremity, correct patient, and correct procedure.   Arthroscopy portals were  marked. Local anesthetic was injected to the planned portal sites. The anterolateral portal was established with an 11  blade.      The arthroscope was placed in the anterolateral portal and then into the suprapatellar pouch.  A diagnostic knee scope was completed with the above findings. The medial meniscus tear was identified as described above.   Next the medial portal was established under needle localization. The MCL was pie-crusted to improve visualization of the posterior horn. The meniscal tear was debrided using an arthroscopic biter and an oscillating shaver until the meniscus had stable borders. A chondroplasty was performed of the patellofemoral compartment such that there were stable cartilage edges without any loose fragments of cartilage.   A partial synovectomy to remove the medial plica band was performed with an oscillating shaver until there were no signs of potential further irritation on the medial femoral condyle.  Arthroscopic fluid was removed from the joint.   The portals were closed with 3-0 Nylon suture. Sterile dressings included Xeroform, 4x4s, Sof-Rol, and Bias wrap. A Polarcare was placed.  The patient was then awakened and taken to the PACU hemodynamically stable without complication.     POSTOPERATIVE PLAN: The patient will be discharged home today once they meet PACU criteria. Aspirin 325 mg daily was prescribed for 2 weeks for DVT prophylaxis.  Physical therapy will start on POD#3-4. Weight-bearing as tolerated. Follow up in 2 weeks per protocol.

## 2019-07-24 NOTE — Anesthesia Preprocedure Evaluation (Addendum)
Anesthesia Evaluation  Patient identified by MRN, date of birth, ID band Patient awake    Reviewed: Allergy & Precautions, NPO status , Patient's Chart, lab work & pertinent test results  History of Anesthesia Complications (+) PONV and history of anesthetic complications  Airway Mallampati: III  TM Distance: <3 FB Neck ROM: Full    Dental   Pulmonary sleep apnea ,    breath sounds clear to auscultation       Cardiovascular (-) angina(-) DOE  Rhythm:Regular Rate:Normal   HLD   Neuro/Psych  Headaches,    GI/Hepatic GERD  Controlled,  Endo/Other    Renal/GU      Musculoskeletal  (+) Arthritis ,   Abdominal (+) + obese (BMI 37),   Peds  Hematology  DVT   Anesthesia Other Findings   Reproductive/Obstetrics                            Anesthesia Physical Anesthesia Plan  ASA: II  Anesthesia Plan: General   Post-op Pain Management:    Induction: Intravenous  PONV Risk Score and Plan: 4 or greater and Ondansetron, Dexamethasone, Treatment may vary due to age or medical condition, Midazolam and Scopolamine patch - Pre-op  Airway Management Planned: LMA  Additional Equipment:   Intra-op Plan:   Post-operative Plan: Extubation in OR  Informed Consent: I have reviewed the patients History and Physical, chart, labs and discussed the procedure including the risks, benefits and alternatives for the proposed anesthesia with the patient or authorized representative who has indicated his/her understanding and acceptance.       Plan Discussed with: CRNA and Anesthesiologist  Anesthesia Plan Comments:        Anesthesia Quick Evaluation

## 2019-07-25 ENCOUNTER — Encounter: Payer: Self-pay | Admitting: Orthopedic Surgery

## 2019-09-10 ENCOUNTER — Other Ambulatory Visit: Payer: Self-pay | Admitting: Internal Medicine

## 2019-09-10 DIAGNOSIS — Z1231 Encounter for screening mammogram for malignant neoplasm of breast: Secondary | ICD-10-CM

## 2019-09-11 ENCOUNTER — Other Ambulatory Visit: Payer: Self-pay | Admitting: Internal Medicine

## 2019-09-11 DIAGNOSIS — M7989 Other specified soft tissue disorders: Secondary | ICD-10-CM

## 2019-09-11 DIAGNOSIS — Z0000 Encounter for general adult medical examination without abnormal findings: Secondary | ICD-10-CM

## 2019-09-14 ENCOUNTER — Other Ambulatory Visit: Payer: Self-pay

## 2019-09-14 ENCOUNTER — Ambulatory Visit (INDEPENDENT_AMBULATORY_CARE_PROVIDER_SITE_OTHER): Payer: BC Managed Care – PPO | Admitting: Vascular Surgery

## 2019-09-14 ENCOUNTER — Encounter (INDEPENDENT_AMBULATORY_CARE_PROVIDER_SITE_OTHER): Payer: Self-pay | Admitting: Vascular Surgery

## 2019-09-14 VITALS — BP 128/76 | HR 80 | Resp 18 | Ht 65.0 in | Wt 219.0 lb

## 2019-09-14 DIAGNOSIS — I890 Lymphedema, not elsewhere classified: Secondary | ICD-10-CM

## 2019-09-14 DIAGNOSIS — M7989 Other specified soft tissue disorders: Secondary | ICD-10-CM

## 2019-09-14 DIAGNOSIS — E785 Hyperlipidemia, unspecified: Secondary | ICD-10-CM

## 2019-09-14 NOTE — Assessment & Plan Note (Signed)
Continue to use the lymphedema pump on the legs.  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  I have discussed the pathophysiology and natural history of lymphedema in some detail today.  Think is also reasonable to consider a venous evaluation of the left upper extremity given her previous history of DVT.  We will do a duplex.  She already has a duplex scheduled for her legs next week at the hospital.  She will follow up with [place] after the venous duplex of the left upper extremity.

## 2019-09-14 NOTE — Assessment & Plan Note (Signed)
lipid control important in reducing the progression of atherosclerotic disease. Continue statin therapy  

## 2019-09-14 NOTE — Progress Notes (Signed)
MRN : [id number]  Sharon Barnes is a 54 y.o. (1965/10/20) female who presents with chief complaint of  Chief Complaint  Patient presents with  . Follow-up    LUE swelling  .  History of Present Illness: Patient returns today in follow up of her lymphedema.  Her symptoms are worse.  Her primary care physician is actually ordered a DVT study that is being done on her right leg next week.  She had surgery on that right knee couple of months ago.  At this point, she is also noticed a marked increase in the swelling and discomfort in the left upper extremity.  This was fairly mild before.  A sleeve for the left arm did not fit her well and she has not been able to wear it.  No ulceration or infection.  No fevers or chills.  She did have surgery on her right knee last fall but no other clear inciting event or causative factor of her symptoms.  Current Outpatient Medications  Medication Sig Dispense Refill  . cetirizine (ZYRTEC) 10 MG tablet Take by mouth.    . clonazePAM (KLONOPIN) 0.5 MG tablet Take by mouth as needed.     . Cyanocobalamin (RA VITAMIN B-12 TR) 1000 MCG TBCR Take by mouth.    . fluticasone (FLONASE) 50 MCG/ACT nasal spray Place into both nostrils 2 (two) times daily.    Marland Kitchen gabapentin (NEURONTIN) 100 MG capsule Take 100 mg by mouth 3 (three) times daily. Pt takes 300 mg at bedtime    . ibuprofen (ADVIL) 800 MG tablet TAKE 1 TABLET BY MOUTH 3 TIMES A DAY FOR 14 DAYS    . Multiple Vitamin (MULTI-VITAMINS) TABS Take by mouth.    Marland Kitchen acetaminophen (TYLENOL) 500 MG tablet Take 2 tablets (1,000 mg total) by mouth every 8 (eight) hours. (Patient not taking: Reported on 09/14/2019) 90 tablet 2  . HYDROcodone-acetaminophen (NORCO) 5-325 MG tablet Take 1-2 tablets by mouth every 4 (four) hours as needed for moderate pain or severe pain. (Patient not taking: Reported on 09/14/2019) 10 tablet 0  . ondansetron (ZOFRAN ODT) 4 MG disintegrating tablet Take 1 tablet (4 mg total) by mouth every 8  (eight) hours as needed for nausea or vomiting. (Patient not taking: Reported on 09/14/2019) 20 tablet 0  . traMADol (ULTRAM) 50 MG tablet Take 50 mg by mouth every 6 (six) hours as needed.     No current facility-administered medications for this visit.    Past Medical History:  Diagnosis Date  . Arthritis    knees  . DVT (deep venous thrombosis) ([place]) 07/15/2014   Right leg  . GERD (gastroesophageal reflux disease)   . Hyperlipidemia   . Lymphedema   . Motion sickness    circular motion  . Numbness of feet   . PONV (postoperative nausea and vomiting)    after hysterectomy  . Sleep apnea    CPAP    Past Surgical History:  Procedure Laterality Date  . ABDOMINAL HYSTERECTOMY    . CHOLECYSTECTOMY    . KNEE ARTHROSCOPY WITH MENISCAL REPAIR Right 07/24/2019   Procedure: KNEE ARTHROSCOPY WITH PARTIAL MENISECTOMY CHONDROPLASTY, PATELLOFEMORAL COMPARTMENT, Plica Removal;  Surgeon: Leim Fabry, MD;  Location: [facility];  Service: Orthopedics;  Laterality: Right;  sleep apnea  . NASAL SINUS SURGERY       Social History   Tobacco Use  . Smoking status: Never Smoker  . Smokeless tobacco: Never Used  Substance Use Topics  . Alcohol use:  Yes    Comment: 1x/month  . Drug use: No    Family History  Problem Relation Age of Onset  . Heart disease Mother   . Cancer Father   . Heart disease Maternal Grandmother   . Cancer Maternal Grandfather   . Emphysema Paternal Grandmother      No Known Allergies   REVIEW OF SYSTEMS (Negative unless checked) Review of Systems: Negative Unless Checked Constitutional: [] ?Weight loss[] ?Fever[] ?Chills Cardiac:[] ?Chest pain[] ? Atrial Fibrillation[] ?Palpitations [] ?Shortness of breath when laying flat [] ?Shortness of breath with exertion. [] ?Shortness of breath at rest Vascular: [] ?Pain in legs with walking[] ?Pain in legswith standing[] ?Pain in legs when laying flat [] ?Claudication  [] ?Pain in feet when laying  flat [x] ?History of DVT [] ?Phlebitis [x] ?Swelling in legs [x] ?Varicose veins [] ?Non-healing ulcers Pulmonary: [] ?Uses home oxygen [] ?Productive cough[] ?Hemoptysis [] ?Wheeze [] ?COPD [] ?Asthma Neurologic: [] ?Dizziness[] ?Seizures [] ?Blackouts[] ?History of stroke [] ?History of TIA[] ?Aphasia [] ?Temporary Blindness[] ?Weaknessor numbness in arm [] ?Weakness or numbnessin leg Musculoskeletal:[] ?Joint swelling [] ?Joint pain [] ?Low back pain  [] ? History of Knee Replacement [x] ?Arthritis [] ?back Surgeries[] ? Spinal Stenosis  Hematologic:[] ?Easy bruising[] ?Easy bleeding [] ?Hypercoagulable state [] ?Anemic Gastrointestinal:[] ?Diarrhea [] ?Vomiting[] ?Gastroesophageal reflux/heartburn[] ?Difficulty swallowing. [] ?Abdominal pain Genitourinary: [] ?Chronic kidney disease [] ?Difficulturination [] ?Anuric[] ?Blood in urine [] ?Frequenturination [] ?Burning with urination[] ?Hematuria Skin: [] ?Rashes [] ?Ulcers [] ?Wounds Psychological: [] ?History of anxiety[] ?History of major depression  [] ? Memory Difficulties  Physical Examination  BP 128/76 (BP Location: Right Arm)   Pulse 80   Resp 18   Ht 5\' 5"  (1.651 m)   Wt 219 lb (99.3 kg)   BMI 36.44 kg/m  Gen:  WD/WN, NAD Head: [place]/AT, No temporalis wasting. Ear/Nose/Throat: Hearing grossly intact, nares w/o erythema or drainage Eyes: Conjunctiva clear. Sclera non-icteric Neck: Supple.  Trachea midline Pulmonary:  Good air movement, no use of accessory muscles.  Cardiac: RRR, no JVD Vascular:  Vessel Right Left  Radial Palpable Palpable                          PT Palpable Palpable  DP Palpable Palpable    Musculoskeletal: M/S 5/5 throughout.  No deformity or atrophy.  1-2+ right lower extremity edema, 1+ left lower extremity edema.  She now has 2+ left upper extremity edema as well involving all the way down to her digits. Neurologic: Sensation grossly intact in  extremities.  Symmetrical.  Speech is fluent.  Psychiatric: Judgment intact, Mood & affect appropriate for pt's clinical situation. Dermatologic: No rashes or ulcers noted.  No cellulitis or open wounds.       Labs Recent Results (from the past 2160 hour(s))  SARS CORONAVIRUS 2 (TAT 6-24 HRS) Nasopharyngeal Nasopharyngeal Swab     Status: None   Collection Time: 07/20/19 11:32 AM   Specimen: Nasopharyngeal Swab  Result Value Ref Range   SARS Coronavirus 2 NEGATIVE NEGATIVE    Comment: (NOTE) SARS-CoV-2 target nucleic acids are NOT DETECTED. The SARS-CoV-2 RNA is generally detectable in upper and lower respiratory specimens during the acute phase of infection. Negative results do not preclude SARS-CoV-2 infection, do not rule out co-infections with other pathogens, and should not be used as the sole basis for treatment or other patient management decisions. Negative results must be combined with clinical observations, patient history, and epidemiological information. The expected result is Negative. Fact Sheet for Patients: SugarRoll.be Fact Sheet for Healthcare Providers: https://www.woods-mathews.com/ This test is not yet approved or cleared by the [place] FDA and  has been authorized for detection and/or diagnosis of SARS-CoV-2 by FDA under an Emergency Use Authorization (EUA). This EUA will remain  in effect (meaning this  test can be used) for the duration of the COVID-19 declaration under Section 56 4(b)(1) of the Act, 21 U.S.C. section 360bbb-3(b)(1), unless the authorization is terminated or revoked sooner. Performed at [facility], [place] [address] [place], [place] [address]     Radiology No results found.  Assessment/Plan  Hyperlipidemia lipid control important in reducing the progression of atherosclerotic disease. Continue statin therapy   Lymphedema Continue to use the lymphedema pump on the legs.  At  this point, the lymphedema in her arm has reached stage II with swelling refractory to elevation, some thickening of the skin and she would benefit from a lymphedema pump for the left arm as well.  I have discussed the pathophysiology and natural history of lymphedema in some detail today.  Think is also reasonable to consider a venous evaluation of the left upper extremity given her previous history of DVT.  We will do a duplex.  She already has a duplex scheduled for her legs next week at the hospital.  She will follow up with [place] after the venous duplex of the left upper extremity.  Swelling of limb Symptoms are worsened now involving the left arm.  Continue the lymphedema pump on the legs.  Add the lymphedema pump to the arm.  Probably needs to find some sort of compression sleeve that is working better for her arm and her legs.    Leotis Pain, MD  09/14/2019 10:46 AM    This note was created with Dragon medical transcription system.  Any errors from dictation are purely unintentional

## 2019-09-14 NOTE — Patient Instructions (Signed)

## 2019-09-14 NOTE — Assessment & Plan Note (Signed)
Symptoms are worsened now involving the left arm.  Continue the lymphedema pump on the legs.  Add the lymphedema pump to the arm.  Probably needs to find some sort of compression sleeve that is working better for her arm and her legs.

## 2019-09-17 ENCOUNTER — Other Ambulatory Visit: Payer: Self-pay

## 2019-09-17 ENCOUNTER — Ambulatory Visit
Admission: RE | Admit: 2019-09-17 | Discharge: 2019-09-17 | Disposition: A | Discharge status: Home / Self Care | Payer: BC Managed Care – PPO | Source: Ambulatory Visit | Attending: Internal Medicine | Admitting: Internal Medicine

## 2019-09-17 DIAGNOSIS — Z0000 Encounter for general adult medical examination without abnormal findings: Secondary | ICD-10-CM

## 2019-09-17 DIAGNOSIS — M7989 Other specified soft tissue disorders: Secondary | ICD-10-CM

## 2019-09-20 ENCOUNTER — Ambulatory Visit: Payer: BC Managed Care – PPO | Admitting: Gastroenterology

## 2019-09-20 ENCOUNTER — Other Ambulatory Visit: Payer: Self-pay

## 2019-09-20 ENCOUNTER — Encounter: Payer: Self-pay | Admitting: Gastroenterology

## 2019-09-20 VITALS — BP 136/86 | HR 71 | Temp 98.1°F | Resp 17 | Ht 65.0 in | Wt 222.0 lb

## 2019-09-20 DIAGNOSIS — K219 Gastro-esophageal reflux disease without esophagitis: Secondary | ICD-10-CM | POA: Diagnosis not present

## 2019-09-20 DIAGNOSIS — Z1211 Encounter for screening for malignant neoplasm of colon: Secondary | ICD-10-CM

## 2019-09-20 DIAGNOSIS — K641 Second degree hemorrhoids: Secondary | ICD-10-CM

## 2019-09-20 NOTE — Progress Notes (Signed)
Cephas Darby, MD [address]  [place]  [place], [place] [address]  Main: [phone]  Fax: [phone]    Gastroenterology Consultation  Referring Provider:     Idelle Crouch, MD Primary Care Physician:  Idelle Crouch, MD Primary Gastroenterologist:  Dr. Cephas Darby Reason for Consultation: Symptomatic hemorrhoids, chronic GERD        HPI:   Sharon Barnes is a 54 y.o. female referred by Dr. Doy Hutching, Leonie Douglas, MD  for consultation & management of symptomatic hemorrhoids and chronic GERD.  Patient reports that she has been suffering from hemorrhoidal symptoms including perianal itching, discomfort, prolapse, burning.  Also, she denies constipation, she does report straining.  She denies rectal bleeding.  She does report flareup of the symptoms about once or twice a month that are partially relieved with over-the-counter hemorrhoidal creams.  She wanted to discuss about hemorrhoid management before undergoing screening colonoscopy.  She also has history of chronic GERD, OSA on CPAP, nocturnal heartburn.  Her symptoms are responding to antireflux lifestyle, she is following dietary modifications which she thinks is helping.  She denies dysphagia, nausea or vomiting.  She does report abdominal bloating, denies abdominal pain.  She does have remote history of DVT with hypercoagulable work-up negative, history of lymphedema in lower extremities, seeing Dr. Festus Holts for lymphedema in her left upper arm and is scheduled to undergo ultrasound next week.  Patient is also evaluated by cardiology in the past for swelling of legs and cardiac work-up was negative  Patient is a high Education officer, museum and teaches business  She does not smoke, alcohol occasionally  NSAIDs: None  Antiplts/Anticoagulants/Anti thrombotics: None  GI Procedures: Reports having had an upper endoscopy more than 15 years ago She denies family history of GI malignancy  Past Medical History:  Diagnosis  Date  . Arthritis    knees  . DVT (deep venous thrombosis) ([place]) 07/15/2014   Right leg  . GERD (gastroesophageal reflux disease)   . Hyperlipidemia   . Lymphedema   . Motion sickness    circular motion  . Numbness of feet   . PONV (postoperative nausea and vomiting)    after hysterectomy  . Sleep apnea    CPAP    Past Surgical History:  Procedure Laterality Date  . ABDOMINAL HYSTERECTOMY    . CHOLECYSTECTOMY    . KNEE ARTHROSCOPY WITH MENISCAL REPAIR Right 07/24/2019   Procedure: KNEE ARTHROSCOPY WITH PARTIAL MENISECTOMY CHONDROPLASTY, PATELLOFEMORAL COMPARTMENT, Plica Removal;  Surgeon: Leim Fabry, MD;  Location: [facility];  Service: Orthopedics;  Laterality: Right;  sleep apnea  . NASAL SINUS SURGERY      Current Outpatient Medications:  .  cetirizine (ZYRTEC) 10 MG tablet, Take by mouth., Disp: , Rfl:  .  clonazePAM (KLONOPIN) 0.5 MG tablet, Take by mouth as needed. , Disp: , Rfl:  .  Cyanocobalamin (RA VITAMIN B-12 TR) 1000 MCG TBCR, Take by mouth., Disp: , Rfl:  .  fluticasone (FLONASE) 50 MCG/ACT nasal spray, Place into both nostrils 2 (two) times daily., Disp: , Rfl:  .  gabapentin (NEURONTIN) 100 MG capsule, Take 100 mg by mouth 3 (three) times daily. Pt takes 300 mg at bedtime, Disp: , Rfl:  .  ibuprofen (ADVIL) 800 MG tablet, TAKE 1 TABLET BY MOUTH 3 TIMES A DAY FOR 14 DAYS, Disp: , Rfl:  .  Multiple Vitamin (MULTI-VITAMINS) TABS, Take by mouth., Disp: , Rfl:  .  acetaminophen (TYLENOL) 500 MG tablet, Take 2  tablets (1,000 mg total) by mouth every 8 (eight) hours. (Patient not taking: Reported on 09/14/2019), Disp: 90 tablet, Rfl: 2 .  HYDROcodone-acetaminophen (NORCO) 5-325 MG tablet, Take 1-2 tablets by mouth every 4 (four) hours as needed for moderate pain or severe pain. (Patient not taking: Reported on 09/14/2019), Disp: 10 tablet, Rfl: 0 .  ondansetron (ZOFRAN ODT) 4 MG disintegrating tablet, Take 1 tablet (4 mg total) by mouth every 8 (eight) hours as  needed for nausea or vomiting. (Patient not taking: Reported on 09/14/2019), Disp: 20 tablet, Rfl: 0 .  traMADol (ULTRAM) 50 MG tablet, Take 50 mg by mouth every 6 (six) hours as needed., Disp: , Rfl:    Family History  Problem Relation Age of Onset  . Heart disease Mother   . Cancer Father   . Heart disease Maternal Grandmother   . Cancer Maternal Grandfather   . Emphysema Paternal Grandmother      Social History   Tobacco Use  . Smoking status: Never Smoker  . Smokeless tobacco: Never Used  Substance Use Topics  . Alcohol use: Yes    Comment: 1x/month  . Drug use: No    Allergies as of 09/20/2019  . (No Known Allergies)    Review of Systems:    All systems reviewed and negative except where noted in HPI.   Physical Exam:  BP 136/86 (BP Location: Left Arm, Patient Position: Sitting, Cuff Size: Large)   Pulse 71   Temp 98.1 F (36.7 C)   Resp 17   Ht 5\' 5"  (1.651 m)   Wt 222 lb (100.7 kg)   BMI 36.94 kg/m  No LMP recorded. Patient has had a hysterectomy.  General:   Alert,  Well-developed, well-nourished, pleasant and cooperative in NAD Head:  Normocephalic and atraumatic. Eyes:  Sclera clear, no icterus.   Conjunctiva pink. Ears:  Normal auditory acuity. Nose:  No deformity, discharge, or lesions. Mouth:  No deformity or lesions,oropharynx pink & moist. Neck:  Supple; short and thick neck, no masses or thyromegaly. Lungs:  Respirations even and unlabored.  Clear throughout to auscultation.   No wheezes, crackles, or rhonchi. No acute distress. Heart:  Regular rate and rhythm; no murmurs, clicks, rubs, or gallops. Abdomen:  Normal bowel sounds. Soft, obese, non-tender and diffusely distended, tympanic to percussion without masses, hepatosplenomegaly or hernias noted.  No guarding or rebound tenderness.   Rectal: Not performed Msk:  Symmetrical without gross deformities. Good, equal movement & strength bilaterally. Pulses:  Normal pulses noted. Extremities:  No  clubbing or edema.  No cyanosis. Neurologic:  Alert and oriented x3;  grossly normal neurologically. Skin:  Intact without significant lesions or rashes. No jaundice. Psych:  Alert and cooperative. Normal mood and affect.  Imaging Studies: Reviewed  Assessment and Plan:   Joniqua Lukins is a 54 y.o. female with obesity, BMI 36, OSA on CPAP, lymphedema, remote history of DVT is seen in consultation for symptomatic external hemorrhoids and chronic GERD  Symptomatic external hemorrhoids, flareup about twice a month Discussed with her about hemorrhoid ligation after screening colonoscopy and patient is agreeable Discussed with her about high-fiber diet and adequate intake of water to keep bowels regular, advised on proper toilet hygiene  Chronic GERD Continue antireflux measures as she is doing Recommend EGD for screening Barrett's she is at high risk  Colon cancer screening Recommend colonoscopy and patient is agreeable  I have discussed alternative options, risks & benefits,  which include, but are not limited to, bleeding,  infection, perforation,respiratory complication & drug reaction.  The patient agrees with this plan & written consent will be obtained.     Follow up in 4 to 6 weeks   Cephas Darby, MD

## 2019-09-20 NOTE — Patient Instructions (Signed)
High-Fiber Diet Fiber, also called dietary fiber, is a type of carbohydrate that is found in fruits, vegetables, whole grains, and beans. A high-fiber diet can have many health benefits. Your health care provider may recommend a high-fiber diet to help:  Prevent constipation. Fiber can make your bowel movements more regular.  Lower your cholesterol.  Relieve the following conditions: ? Swelling of veins in the anus (hemorrhoids). ? Swelling and irritation (inflammation) of specific areas of the digestive tract (uncomplicated diverticulosis). ? A problem of the large intestine (colon) that sometimes causes pain and diarrhea (irritable bowel syndrome, IBS).  Prevent overeating as part of a weight-loss plan.  Prevent heart disease, type 2 diabetes, and certain cancers. What is my plan? The recommended daily fiber intake in grams (g) includes:  38 g for men age 50 or younger.  30 g for men over age 50.  25 g for women age 50 or younger.  21 g for women over age 50. You can get the recommended daily intake of dietary fiber by:  Eating a variety of fruits, vegetables, grains, and beans.  Taking a fiber supplement, if it is not possible to get enough fiber through your diet. What do I need to know about a high-fiber diet?  It is better to get fiber through food sources rather than from fiber supplements. There is not a lot of research about how effective supplements are.  Always check the fiber content on the nutrition facts label of any prepackaged food. Look for foods that contain 5 g of fiber or more per serving.  Talk with a diet and nutrition specialist (dietitian) if you have questions about specific foods that are recommended or not recommended for your medical condition, especially if those foods are not listed below.  Gradually increase how much fiber you consume. If you increase your intake of dietary fiber too quickly, you may have bloating, cramping, or gas.  Drink plenty  of water. Water helps you to digest fiber. What are tips for following this plan?  Eat a wide variety of high-fiber foods.  Make sure that half of the grains that you eat each day are whole grains.  Eat breads and cereals that are made with whole-grain flour instead of refined flour or white flour.  Eat brown rice, bulgur wheat, or millet instead of white rice.  Start the day with a breakfast that is high in fiber, such as a cereal that contains 5 g of fiber or more per serving.  Use beans in place of meat in soups, salads, and pasta dishes.  Eat high-fiber snacks, such as berries, raw vegetables, nuts, and popcorn.  Choose whole fruits and vegetables instead of processed forms like juice or sauce. What foods can I eat?  Fruits Berries. Pears. Apples. Oranges. Avocado. Prunes and raisins. Dried figs. Vegetables Sweet potatoes. Spinach. Kale. Artichokes. Cabbage. Broccoli. Cauliflower. Josclyn Rosales peas. Carrots. Squash. Grains Whole-grain breads. Multigrain cereal. Oats and oatmeal. Brown rice. Barley. Bulgur wheat. Millet. Quinoa. Bran muffins. Popcorn. Rye wafer crackers. Meats and other proteins Navy, kidney, and pinto beans. Soybeans. Split peas. Lentils. Nuts and seeds. Dairy Fiber-fortified yogurt. Beverages Fiber-fortified soy milk. Fiber-fortified orange juice. Other foods Fiber bars. The items listed above may not be a complete list of recommended foods and beverages. Contact a dietitian for more options. What foods are not recommended? Fruits Fruit juice. Cooked, strained fruit. Vegetables Fried potatoes. Canned vegetables. Well-cooked vegetables. Grains White bread. Pasta made with refined flour. White rice. Meats and other   proteins Fatty cuts of meat. Fried chicken or fried fish. Dairy Milk. Yogurt. Cream cheese. Sour cream. Fats and oils Butters. Beverages Soft drinks. Other foods Cakes and pastries. The items listed above may not be a complete list of foods  and beverages to avoid. Contact a dietitian for more information. Summary  Fiber is a type of carbohydrate. It is found in fruits, vegetables, whole grains, and beans.  There are many health benefits of eating a high-fiber diet, such as preventing constipation, lowering blood cholesterol, helping with weight loss, and reducing your risk of heart disease, diabetes, and certain cancers.  Gradually increase your intake of fiber. Increasing too fast can result in cramping, bloating, and gas. Drink plenty of water while you increase your fiber.  The best sources of fiber include whole fruits and vegetables, whole grains, nuts, seeds, and beans. This information is not intended to replace advice given to you by your health care provider. Make sure you discuss any questions you have with your health care provider. Document Revised: 06/13/2017 Document Reviewed: 06/13/2017 Elsevier Patient Education  2020 [place] for Gastroesophageal Reflux Disease, Adult When you have gastroesophageal reflux disease (GERD), the foods you eat and your eating habits are very important. Choosing the right foods can help ease your discomfort. Think about working with a nutrition specialist (dietitian) to help you make good choices. What are tips for following this plan?  Meals  Choose healthy foods that are low in fat, such as fruits, vegetables, whole grains, low-fat dairy products, and lean meat, fish, and poultry.  Eat small meals often instead of 3 large meals a day. Eat your meals slowly, and in a place where you are relaxed. Avoid bending over or lying down until 2-3 hours after eating.  Avoid eating meals 2-3 hours before bed.  Avoid drinking a lot of liquid with meals.  Cook foods using methods other than frying. Bake, grill, or broil food instead.  Avoid or limit: ? Chocolate. ? Peppermint or spearmint. ? Alcohol. ? Pepper. ? Black and decaffeinated coffee. ? Black and  decaffeinated tea. ? Bubbly (carbonated) soft drinks. ? Caffeinated energy drinks and soft drinks.  Limit high-fat foods such as: ? Fatty meat or fried foods. ? Whole milk, cream, butter, or ice cream. ? Nuts and nut butters. ? Pastries, donuts, and sweets made with butter or shortening.  Avoid foods that cause symptoms. These foods may be different for everyone. Common foods that cause symptoms include: ? Tomatoes. ? Oranges, lemons, and limes. ? Peppers. ? Spicy food. ? Onions and garlic. ? Vinegar. Lifestyle  Maintain a healthy weight. Ask your doctor what weight is healthy for you. If you need to lose weight, work with your doctor to do so safely.  Exercise for at least 30 minutes for 5 or more days each week, or as told by your doctor.  Wear loose-fitting clothes.  Do not smoke. If you need help quitting, ask your doctor.  Sleep with the head of your bed higher than your feet. Use a wedge under the mattress or blocks under the bed frame to raise the head of the bed. Summary  When you have gastroesophageal reflux disease (GERD), food and lifestyle choices are very important in easing your symptoms.  Eat small meals often instead of 3 large meals a day. Eat your meals slowly, and in a place where you are relaxed.  Limit high-fat foods such as fatty meat or fried foods.  Avoid bending over or  lying down until 2-3 hours after eating.  Avoid peppermint and spearmint, caffeine, alcohol, and chocolate. This information is not intended to replace advice given to you by your health care provider. Make sure you discuss any questions you have with your health care provider. Document Revised: 11/30/2018 Document Reviewed: 09/14/2016 Elsevier Patient Education  [place].

## 2019-09-27 ENCOUNTER — Encounter: Payer: Self-pay | Admitting: Gastroenterology

## 2019-09-28 ENCOUNTER — Encounter (INDEPENDENT_AMBULATORY_CARE_PROVIDER_SITE_OTHER): Payer: Self-pay | Admitting: Vascular Surgery

## 2019-09-28 ENCOUNTER — Ambulatory Visit (INDEPENDENT_AMBULATORY_CARE_PROVIDER_SITE_OTHER): Payer: BC Managed Care – PPO | Admitting: Vascular Surgery

## 2019-09-28 ENCOUNTER — Other Ambulatory Visit: Payer: Self-pay

## 2019-09-28 ENCOUNTER — Ambulatory Visit (INDEPENDENT_AMBULATORY_CARE_PROVIDER_SITE_OTHER): Payer: BC Managed Care – PPO

## 2019-09-28 VITALS — BP 137/85 | HR 72 | Resp 17 | Ht 65.0 in | Wt 221.0 lb

## 2019-09-28 DIAGNOSIS — I890 Lymphedema, not elsewhere classified: Secondary | ICD-10-CM

## 2019-09-28 DIAGNOSIS — E785 Hyperlipidemia, unspecified: Secondary | ICD-10-CM

## 2019-09-28 DIAGNOSIS — M7989 Other specified soft tissue disorders: Secondary | ICD-10-CM | POA: Diagnosis not present

## 2019-09-28 NOTE — Assessment & Plan Note (Signed)
We did a study on her left arm today and saw no venous abnormality in left upper extremity with no superficial thrombophlebitis or DVT.  The central veins are difficult to assess with a duplex study but there was no suggestion of central venous issue.  She clearly has stage II lymphedema in her legs and has had a pump for some time, and now would clearly benefit from having a pump on her left arm she has developed lymphedema in her left arm.  A lymphedema pump would be an excellent adjuvant therapy for her arm as well as her legs.  We will try to get that obtained in the near future.  We will see her back in a few months to assess her response to conservative therapy.

## 2019-09-28 NOTE — Assessment & Plan Note (Signed)
lipid control important in reducing the progression of atherosclerotic disease. Continue statin therapy  

## 2019-09-28 NOTE — Progress Notes (Signed)
MRN : [id number]  Sharon Barnes is a 54 y.o. (April 30, 1966) female who presents with chief complaint of  Chief Complaint  Patient presents with  . Follow-up    ultrasound  .  History of Present Illness: Patient returns today in follow up of her arm and leg swelling. She had a negative DVT study in the right leg at the hospital since her last visit. Her leg arm swelling is some better.  We did a study on her left arm today and saw no venous abnormality in left upper extremity with no superficial thrombophlebitis or DVT.  The central veins are difficult to assess with a duplex study but there was no suggestion of central venous issue.  She clearly has stage II lymphedema in her legs and has had a pump for some time, and now would clearly benefit from having a pump on her left arm she has developed lymphedema in her left arm.  Current Outpatient Medications  Medication Sig Dispense Refill  . cetirizine (ZYRTEC) 10 MG tablet Take by mouth.    . clonazePAM (KLONOPIN) 0.5 MG tablet Take by mouth as needed.     . Cyanocobalamin (RA VITAMIN B-12 TR) 1000 MCG TBCR Take by mouth.    . fluticasone (FLONASE) 50 MCG/ACT nasal spray Place into both nostrils 2 (two) times daily.    Marland Kitchen gabapentin (NEURONTIN) 100 MG capsule Take 100 mg by mouth 3 (three) times daily. Pt takes 300 mg at bedtime    . ibuprofen (ADVIL) 800 MG tablet TAKE 1 TABLET BY MOUTH 3 TIMES A DAY FOR 14 DAYS    . Multiple Vitamin (MULTI-VITAMINS) TABS Take by mouth.    Marland Kitchen acetaminophen (TYLENOL) 500 MG tablet Take 2 tablets (1,000 mg total) by mouth every 8 (eight) hours. (Patient not taking: Reported on 09/14/2019) 90 tablet 2  . HYDROcodone-acetaminophen (NORCO) 5-325 MG tablet Take 1-2 tablets by mouth every 4 (four) hours as needed for moderate pain or severe pain. (Patient not taking: Reported on 09/14/2019) 10 tablet 0  . ondansetron (ZOFRAN ODT) 4 MG disintegrating tablet Take 1 tablet (4 mg total) by mouth every 8 (eight) hours as  needed for nausea or vomiting. (Patient not taking: Reported on 09/14/2019) 20 tablet 0  . traMADol (ULTRAM) 50 MG tablet Take 50 mg by mouth every 6 (six) hours as needed.     No current facility-administered medications for this visit.    Past Medical History:  Diagnosis Date  . Arthritis    knees  . DVT (deep venous thrombosis) ([place]) 07/15/2014   Right leg  . GERD (gastroesophageal reflux disease)   . Hyperlipidemia   . Lymphedema   . Motion sickness    circular motion  . Numbness of feet   . PONV (postoperative nausea and vomiting)    after hysterectomy  . Sleep apnea    CPAP    Past Surgical History:  Procedure Laterality Date  . ABDOMINAL HYSTERECTOMY    . CHOLECYSTECTOMY    . KNEE ARTHROSCOPY WITH MENISCAL REPAIR Right 07/24/2019   Procedure: KNEE ARTHROSCOPY WITH PARTIAL MENISECTOMY CHONDROPLASTY, PATELLOFEMORAL COMPARTMENT, [place]lica Removal;  Surgeon: Leim Fabry, MD;  Location: [facility];  Service: Orthopedics;  Laterality: Right;  sleep apnea  . NASAL SINUS SURGERY     Social History        Tobacco Use  . Smoking status: Never Smoker  . Smokeless tobacco: Never Used  Substance Use Topics  . Alcohol use: Yes    Comment:  1x/month  . Drug use: No         Family History  Problem Relation Age of Onset  . Heart disease Mother   . Cancer Father   . Heart disease Maternal Grandmother   . Cancer Maternal Grandfather   . Emphysema Paternal Grandmother      No Known Allergies   REVIEW OF SYSTEMS (Negative unless checked) Review of Systems: Negative Unless Checked Constitutional: [] ??Weight loss[] ??Fever[] ??Chills Cardiac:[] ??Chest pain[] ??Atrial Fibrillation[] ??Palpitations [] ??Shortness of breath when laying flat [] ??Shortness of breath with exertion. [] ??Shortness of breath at rest Vascular: [] ??Pain in legs with walking[] ??Pain in legswith standing[] ??Pain in legs when laying flat [] ??Claudication [] ??Pain  in feet when laying flat [x] ??History of DVT [] ??Phlebitis [x] ??Swelling in legs [x] ??Varicose veins [] ??Non-healing ulcers Pulmonary: [] ??Uses home oxygen [] ??Productive cough[] ??Hemoptysis [] ??Wheeze [] ??COPD [] ??Asthma Neurologic: [] ??Dizziness[] ??Seizures [] ??Blackouts[] ??History of stroke [] ??History of TIA[] ??Aphasia [] ??Temporary Blindness[] ??Weaknessor numbness in arm [] ??Weakness or numbnessin leg Musculoskeletal:[] ??Joint swelling [] ??Joint pain [] ??Low back pain [] ??History of Knee Replacement [x] ??Arthritis [] ??back Surgeries[] ??Spinal Stenosis  Hematologic:[] ??Easy bruising[] ??Easy bleeding [] ??Hypercoagulable state [] ??Anemic Gastrointestinal:[] ??Diarrhea [] ??Vomiting[] ??Gastroesophageal reflux/heartburn[] ??Difficulty swallowing. [] ??Abdominal pain Genitourinary: [] ??Chronic kidney disease [] ??Difficulturination [] ??Anuric[] ??Blood in urine [] ??Frequenturination [] ??Burning with urination[] ??Hematuria Skin: [] ??Rashes [] ??Ulcers [] ??Wounds Psychological: [] ??History of anxiety[] ??History of major depression [] ??Memory Difficulties   Physical Examination  BP 137/85 (BP Location: Right Arm)   Pulse 72   Resp 17   Ht 5\' 5"  (1.651 m)   Wt 221 lb (100.2 kg)   BMI 36.78 kg/m  Gen:  WD/WN, NAD Head: /AT, No temporalis wasting. Ear/Nose/Throat: Hearing grossly intact, nares w/o erythema or drainage Eyes: Conjunctiva clear. Sclera non-icteric Neck: Supple.  Trachea midline Pulmonary:  Good air movement, no use of accessory muscles.  Cardiac: RRR, no JVD Vascular:  Vessel Right Left  Radial Palpable Palpable                          PT Palpable Palpable  DP Palpable Palpable   Gastrointestinal: soft, non-tender/non-distended. No guarding/reflex.  Musculoskeletal: M/S 5/5 throughout.  No deformity or atrophy. 2+ RLE edema, 1+ LLE edema, 1+ LUE edema. Neurologic: Sensation  grossly intact in extremities.  Symmetrical.  Speech is fluent.  Psychiatric: Judgment intact, Mood & affect appropriate for pt's clinical situation. Dermatologic: No rashes or ulcers noted.  No cellulitis or open wounds.       Labs Recent Results (from the past 2160 hour(s))  SARS CORONAVIRUS 2 (TAT 6-24 HRS) Nasopharyngeal Nasopharyngeal Swab     Status: None   Collection Time: 07/20/19 11:32 AM   Specimen: Nasopharyngeal Swab  Result Value Ref Range   SARS Coronavirus 2 NEGATIVE NEGATIVE    Comment: (NOTE) SARS-CoV-2 target nucleic acids are NOT DETECTED. The SARS-CoV-2 RNA is generally detectable in upper and lower respiratory specimens during the acute phase of infection. Negative results do not preclude SARS-CoV-2 infection, do not rule out co-infections with other pathogens, and should not be used as the sole basis for treatment or other patient management decisions. Negative results must be combined with clinical observations, patient history, and epidemiological information. The expected result is Negative. Fact Sheet for Patients: SugarRoll.be Fact Sheet for Healthcare Providers: https://www.woods-mathews.com/ This test is not yet approved or cleared by the [place] FDA and  has been authorized for detection and/or diagnosis of SARS-CoV-2 by FDA under an Emergency Use Authorization (EUA). This EUA will remain  in effect (meaning this test can be used) for the duration of the COVID-19 declaration under Section 56 4(b)(1) of the Act, 21 U.S.C. section 360bbb-3(b)(1),  unless the authorization is terminated or revoked sooner. Performed at [facility], [place] [address] [place], [place] [address]     Radiology US Venous Img Lower Unilateral Right (DVT)  Result Date: 09/17/2019 CLINICAL DATA:  Right lower extremity pain and edema. History of knee surgery on 07/24/2019. History of lymphedema. Evaluate for DVT. EXAM:  RIGHT LOWER EXTREMITY VENOUS DOPPLER ULTRASOUND TECHNIQUE: Gray-scale sonography with graded compression, as well as color Doppler and duplex ultrasound were performed to evaluate the lower extremity deep venous systems from the level of the common femoral vein and including the common femoral, femoral, profunda femoral, popliteal and calf veins including the posterior tibial, peroneal and gastrocnemius veins when visible. The superficial great saphenous vein was also interrogated. Spectral Doppler was utilized to evaluate flow at rest and with distal augmentation maneuvers in the common femoral, femoral and popliteal veins. COMPARISON:  Bilateral lower extremity venous Doppler ultrasound-05/07/2014 FINDINGS: Contralateral Common Femoral Vein: Respiratory phasicity is normal and symmetric with the symptomatic side. No evidence of thrombus. Normal compressibility. Common Femoral Vein: No evidence of thrombus. Normal compressibility, respiratory phasicity and response to augmentation. Saphenofemoral Junction: No evidence of thrombus. Normal compressibility and flow on color Doppler imaging. Profunda Femoral Vein: No evidence of thrombus. Normal compressibility and flow on color Doppler imaging. Femoral Vein: No evidence of thrombus. Normal compressibility, respiratory phasicity and response to augmentation. Popliteal Vein: No evidence of thrombus. Normal compressibility, respiratory phasicity and response to augmentation. Calf Veins: No evidence of thrombus. Normal compressibility and flow on color Doppler imaging. Superficial Great Saphenous Vein: No evidence of thrombus. Normal compressibility. Venous Reflux:  None. Other Findings:  None. IMPRESSION: No evidence of DVT within the right lower extremity. Electronically Signed   By: Sandi Mariscal M.D.   On: 09/17/2019 16:01    Assessment/Plan  Hyperlipidemia lipid control important in reducing the progression of atherosclerotic disease. Continue statin  therapy   Lymphedema We did a study on her left arm today and saw no venous abnormality in left upper extremity with no superficial thrombophlebitis or DVT.  The central veins are difficult to assess with a duplex study but there was no suggestion of central venous issue.  She clearly has stage II lymphedema in her legs and has had a pump for some time, and now would clearly benefit from having a pump on her left arm she has developed lymphedema in her left arm.  A lymphedema pump would be an excellent adjuvant therapy for her arm as well as her legs.  We will try to get that obtained in the near future.  We will see her back in a few months to assess her response to conservative therapy.    Leotis Pain, MD  09/28/2019 12:00 PM    This note was created with Dragon medical transcription system.  Any errors from dictation are purely unintentional

## 2019-10-02 ENCOUNTER — Other Ambulatory Visit
Admission: RE | Admit: 2019-10-02 | Discharge: 2019-10-02 | Disposition: A | Discharge status: Home / Self Care | Payer: BC Managed Care – PPO | Source: Ambulatory Visit | Attending: Gastroenterology | Admitting: Gastroenterology

## 2019-10-02 ENCOUNTER — Other Ambulatory Visit: Payer: Self-pay

## 2019-10-02 ENCOUNTER — Telehealth: Payer: Self-pay | Admitting: Gastroenterology

## 2019-10-02 DIAGNOSIS — Z01812 Encounter for preprocedural laboratory examination: Secondary | ICD-10-CM | POA: Insufficient documentation

## 2019-10-02 DIAGNOSIS — Z20822 Contact with and (suspected) exposure to covid-19: Secondary | ICD-10-CM | POA: Diagnosis not present

## 2019-10-02 LAB — SARS CORONAVIRUS 2 (TAT 6-24 HRS): SARS Coronavirus 2: NEGATIVE

## 2019-10-02 NOTE — Telephone Encounter (Signed)
Pt left vm  She has a procedure on Friday and her insurance will not cover the prep her pharmacy has send over a request for an alternative and has not heard from [place] please call pharmacy and pt back cb [phone]

## 2019-10-02 NOTE — Telephone Encounter (Signed)
Returned patients call regarding SuPrep coverage.  Patient has been asked to come by the office to pick up a bowel prep.  Thanks,  [place], [place]

## 2019-10-04 NOTE — Discharge Instructions (Signed)
General Anesthesia, Adult, Care After This sheet gives you information about how to care for yourself after your procedure. Your health care provider may also give you more specific instructions. If you have problems or questions, contact your health care provider. What can I expect after the procedure? After the procedure, the following side effects are common:  Pain or discomfort at the IV site.  Nausea.  Vomiting.  Sore throat.  Trouble concentrating.  Feeling cold or chills.  Weak or tired.  Sleepiness and fatigue.  Soreness and body aches. These side effects can affect parts of the body that were not involved in surgery. Follow these instructions at home:  For at least 24 hours after the procedure:  Have a responsible adult stay with you. It is important to have someone help care for you until you are awake and alert.  Rest as needed.  Do not: ? Participate in activities in which you could fall or become injured. ? Drive. ? Use heavy machinery. ? Drink alcohol. ? Take sleeping pills or medicines that cause drowsiness. ? Make important decisions or sign legal documents. ? Take care of children on your own. Eating and drinking  Follow any instructions from your health care provider about eating or drinking restrictions.  When you feel hungry, start by eating small amounts of foods that are soft and easy to digest (bland), such as toast. Gradually return to your regular diet.  Drink enough fluid to keep your urine pale yellow.  If you vomit, rehydrate by drinking water, juice, or clear broth. General instructions  If you have sleep apnea, surgery and certain medicines can increase your risk for breathing problems. Follow instructions from your health care provider about wearing your sleep device: ? Anytime you are sleeping, including during daytime naps. ? While taking prescription pain medicines, sleeping medicines, or medicines that make you drowsy.  Return to  your normal activities as told by your health care provider. Ask your health care provider what activities are safe for you.  Take over-the-counter and prescription medicines only as told by your health care provider.  If you smoke, do not smoke without supervision.  Keep all follow-up visits as told by your health care provider. This is important. Contact a health care provider if:  You have nausea or vomiting that does not get better with medicine.  You cannot eat or drink without vomiting.  You have pain that does not get better with medicine.  You are unable to pass urine.  You develop a skin rash.  You have a fever.  You have redness around your IV site that gets worse. Get help right away if:  You have difficulty breathing.  You have chest pain.  You have blood in your urine or stool, or you vomit blood. Summary  After the procedure, it is common to have a sore throat or nausea. It is also common to feel tired.  Have a responsible adult stay with you for the first 24 hours after general anesthesia. It is important to have someone help care for you until you are awake and alert.  When you feel hungry, start by eating small amounts of foods that are soft and easy to digest (bland), such as toast. Gradually return to your regular diet.  Drink enough fluid to keep your urine pale yellow.  Return to your normal activities as told by your health care provider. Ask your health care provider what activities are safe for you. This information is not   intended to replace advice given to you by your health care provider. Make sure you discuss any questions you have with your health care provider. Document Revised: 08/12/2017 Document Reviewed: 03/25/2017 Elsevier Patient Education  2020 Elsevier Inc.  

## 2019-10-05 ENCOUNTER — Ambulatory Visit: Payer: BC Managed Care – PPO | Admitting: Anesthesiology

## 2019-10-05 ENCOUNTER — Encounter: Payer: Self-pay | Admitting: Gastroenterology

## 2019-10-05 ENCOUNTER — Ambulatory Visit
Admission: RE | Admit: 2019-10-05 | Discharge: 2019-10-05 | Disposition: A | Discharge status: Home / Self Care | Payer: BC Managed Care – PPO | Attending: Gastroenterology | Admitting: Gastroenterology

## 2019-10-05 ENCOUNTER — Other Ambulatory Visit: Payer: Self-pay

## 2019-10-05 ENCOUNTER — Encounter
Admission: RE | Disposition: A | Discharge status: Home / Self Care | Payer: Self-pay | Source: Home / Self Care | Attending: Gastroenterology

## 2019-10-05 DIAGNOSIS — Z1211 Encounter for screening for malignant neoplasm of colon: Secondary | ICD-10-CM | POA: Diagnosis not present

## 2019-10-05 DIAGNOSIS — G4730 Sleep apnea, unspecified: Secondary | ICD-10-CM | POA: Diagnosis not present

## 2019-10-05 DIAGNOSIS — D120 Benign neoplasm of cecum: Secondary | ICD-10-CM | POA: Diagnosis not present

## 2019-10-05 DIAGNOSIS — Z79899 Other long term (current) drug therapy: Secondary | ICD-10-CM | POA: Diagnosis not present

## 2019-10-05 DIAGNOSIS — Z90710 Acquired absence of both cervix and uterus: Secondary | ICD-10-CM | POA: Diagnosis not present

## 2019-10-05 DIAGNOSIS — Z6837 Body mass index (BMI) 37.0-37.9, adult: Secondary | ICD-10-CM | POA: Diagnosis not present

## 2019-10-05 DIAGNOSIS — K635 Polyp of colon: Secondary | ICD-10-CM

## 2019-10-05 DIAGNOSIS — Z86718 Personal history of other venous thrombosis and embolism: Secondary | ICD-10-CM | POA: Insufficient documentation

## 2019-10-05 DIAGNOSIS — K219 Gastro-esophageal reflux disease without esophagitis: Secondary | ICD-10-CM | POA: Diagnosis not present

## 2019-10-05 DIAGNOSIS — K644 Residual hemorrhoidal skin tags: Secondary | ICD-10-CM | POA: Insufficient documentation

## 2019-10-05 DIAGNOSIS — K3189 Other diseases of stomach and duodenum: Secondary | ICD-10-CM | POA: Diagnosis not present

## 2019-10-05 DIAGNOSIS — M170 Bilateral primary osteoarthritis of knee: Secondary | ICD-10-CM | POA: Insufficient documentation

## 2019-10-05 DIAGNOSIS — R200 Anesthesia of skin: Secondary | ICD-10-CM | POA: Diagnosis not present

## 2019-10-05 DIAGNOSIS — Z8249 Family history of ischemic heart disease and other diseases of the circulatory system: Secondary | ICD-10-CM | POA: Insufficient documentation

## 2019-10-05 DIAGNOSIS — Z825 Family history of asthma and other chronic lower respiratory diseases: Secondary | ICD-10-CM | POA: Diagnosis not present

## 2019-10-05 DIAGNOSIS — Z9049 Acquired absence of other specified parts of digestive tract: Secondary | ICD-10-CM | POA: Diagnosis not present

## 2019-10-05 DIAGNOSIS — Z791 Long term (current) use of non-steroidal anti-inflammatories (NSAID): Secondary | ICD-10-CM | POA: Diagnosis not present

## 2019-10-05 DIAGNOSIS — D122 Benign neoplasm of ascending colon: Secondary | ICD-10-CM | POA: Insufficient documentation

## 2019-10-05 DIAGNOSIS — Z809 Family history of malignant neoplasm, unspecified: Secondary | ICD-10-CM | POA: Diagnosis not present

## 2019-10-05 DIAGNOSIS — E785 Hyperlipidemia, unspecified: Secondary | ICD-10-CM | POA: Insufficient documentation

## 2019-10-05 HISTORY — PX: COLONOSCOPY WITH PROPOFOL: SHX5780

## 2019-10-05 HISTORY — PX: POLYPECTOMY: SHX5525

## 2019-10-05 HISTORY — PX: ESOPHAGOGASTRODUODENOSCOPY (EGD) WITH PROPOFOL: SHX5813

## 2019-10-05 SURGERY — ESOPHAGOGASTRODUODENOSCOPY (EGD) WITH PROPOFOL
Anesthesia: General | Site: Throat

## 2019-10-05 MED ORDER — ONDANSETRON HCL 4 MG/2ML IJ SOLN
INTRAMUSCULAR | Status: DC | PRN
  Administered 2019-10-05: 4 mg via INTRAVENOUS

## 2019-10-05 MED ORDER — STERILE WATER FOR IRRIGATION IR SOLN
Status: DC | PRN
  Administered 2019-10-05: 08:00:00 .05 mL

## 2019-10-05 MED ORDER — PROPOFOL 10 MG/ML IV BOLUS
INTRAVENOUS | Status: DC | PRN
  Administered 2019-10-05: 20 mg via INTRAVENOUS
  Administered 2019-10-05 (×2): 50 mg via INTRAVENOUS
  Administered 2019-10-05 (×2): 20 mg via INTRAVENOUS
  Administered 2019-10-05 (×7): 50 mg via INTRAVENOUS
  Administered 2019-10-05: 30 mg via INTRAVENOUS
  Administered 2019-10-05: 50 mg via INTRAVENOUS

## 2019-10-05 MED ORDER — GLYCOPYRROLATE 0.2 MG/ML IJ SOLN
INTRAMUSCULAR | Status: DC | PRN
  Administered 2019-10-05: .2 mg via INTRAVENOUS

## 2019-10-05 MED ORDER — LIDOCAINE HCL (CARDIAC) PF 100 MG/5ML IV SOSY
PREFILLED_SYRINGE | INTRAVENOUS | Status: DC | PRN
  Administered 2019-10-05: 30 mg via INTRAVENOUS

## 2019-10-05 MED ORDER — SODIUM CHLORIDE (PF) 0.9 % IJ SOLN
INTRAMUSCULAR | Status: DC | PRN
  Administered 2019-10-05: 5 mL

## 2019-10-05 MED ORDER — LACTATED RINGERS IV SOLN: INTRAVENOUS | Status: DC

## 2019-10-05 MED ORDER — OMEPRAZOLE 40 MG PO CPDR: 40.0000 mg | DELAYED_RELEASE_CAPSULE | Freq: Two times a day (BID) | ORAL | 2 refills | Status: DC

## 2019-10-05 SURGICAL SUPPLY — 10 items
BLOCK BITE 60FR ADLT L/F GRN (MISCELLANEOUS) ×4 IMPLANT
CANISTER SUCT 1200ML W/VALVE (MISCELLANEOUS) ×4 IMPLANT
FORCEPS BIOP RAD 4 LRG CAP 4 (CUTTING FORCEPS) ×4 IMPLANT
GOWN CVR UNV OPN BCK APRN NK (MISCELLANEOUS) ×6 IMPLANT
GOWN ISOL THUMB LOOP REG UNIV (MISCELLANEOUS) ×2
KIT ENDO PROCEDURE OLY (KITS) ×4 IMPLANT
NEEDLE CARR LOCKE SCLERO (NEEDLE) ×4 IMPLANT
SNARE COLD EXACTO (MISCELLANEOUS) ×4 IMPLANT
TRAP ETRAP POLY (MISCELLANEOUS) ×4 IMPLANT
WATER STERILE IRR 250ML POUR (IV SOLUTION) ×4 IMPLANT

## 2019-10-05 NOTE — Anesthesia Postprocedure Evaluation (Signed)
Anesthesia Post Note  Patient: Sharon Barnes  Procedure(s) Performed: ESOPHAGOGASTRODUODENOSCOPY (EGD) WITH PROPOFOL (N/A Throat) COLONOSCOPY WITH PROPOFOL (N/A Rectum) POLYPECTOMY (Rectum)     Patient location during evaluation: PACU Anesthesia Type: General Level of consciousness: awake and alert Pain management: pain level controlled Vital Signs Assessment: post-procedure vital signs reviewed and stable Respiratory status: spontaneous breathing, nonlabored ventilation, respiratory function stable and patient connected to nasal cannula oxygen Cardiovascular status: blood pressure returned to baseline and stable Postop Assessment: no apparent nausea or vomiting Anesthetic complications: no    Adele Barthel Patty Lopezgarcia

## 2019-10-05 NOTE — Op Note (Signed)
[facility] Gastroenterology Patient Name: Sharon Barnes Procedure Date: 10/05/2019 7:27 AM MRN: [id number] Account #: [id number] Date of Birth: 1966/05/18 Admit Type: Outpatient Age: 55 Room: [facility] OR ROOM 01 Gender: Female Note Status: Finalized Procedure:             Colonoscopy Indications:           Screening for colorectal malignant neoplasm, This is                         the patient's first colonoscopy Providers:             Lin Landsman MD, MD Medicines:             Monitored Anesthesia Care Complications:         No immediate complications. Estimated blood loss: None. Procedure:             Pre-Anesthesia Assessment:                        - Prior to the procedure, a History and Physical was                         performed, and patient medications and allergies were                         reviewed. The patient is competent. The risks and                         benefits of the procedure and the sedation options and                         risks were discussed with the patient. All questions                         were answered and informed consent was obtained.                         Patient identification and proposed procedure were                         verified by the physician, the nurse, the                         anesthesiologist, the anesthetist and the technician                         in the pre-procedure area in the procedure room in the                         endoscopy suite. Mental Status Examination: alert and                         oriented. Airway Examination: normal oropharyngeal                         airway and neck mobility. Respiratory Examination:                         clear to auscultation. CV Examination: normal.  Prophylactic Antibiotics: The patient does not require                         prophylactic antibiotics. Prior Anticoagulants: The                         patient has taken no  previous anticoagulant or                         antiplatelet agents. ASA Grade Assessment: II - A                         patient with mild systemic disease. After reviewing                         the risks and benefits, the patient was deemed in                         satisfactory condition to undergo the procedure. The                         anesthesia plan was to use monitored anesthesia care                         (MAC). Immediately prior to administration of                         medications, the patient was re-assessed for adequacy                         to receive sedatives. The heart rate, respiratory                         rate, oxygen saturations, blood pressure, adequacy of                         pulmonary ventilation, and response to care were                         monitored throughout the procedure. The physical                         status of the patient was re-assessed after the                         procedure.                        After obtaining informed consent, the colonoscope was                         passed under direct vision. Throughout the procedure,                         the patient's blood pressure, pulse, and oxygen                         saturations were monitored continuously. The was  introduced through the anus and advanced to the the                         terminal ileum, with identification of the appendiceal                         orifice and IC valve. The colonoscopy was performed                         without difficulty. The patient tolerated the                         procedure well. The quality of the bowel preparation                         was evaluated using the BBPS [facility] Bowel Preparation                         Scale) with scores of: Right Colon = 3, Transverse                         Colon = 3 and Left Colon = 3 (entire mucosa seen well                         with no residual staining, small  fragments of stool or                         opaque liquid). The total BBPS score equals 9. Findings:      Skin tags were found on perianal exam.      The terminal ileum appeared normal.      A 5 mm polyp was found in the cecum. The polyp was flat. Preparations       were made for mucosal resection. Saline was injected to raise the       lesion. Snare mucosal resection was performed. Resection and retrieval       were complete.      A 6 mm polyp was found in the ascending colon. The polyp was sessile.       The polyp was removed with a cold snare. Resection and retrieval were       complete.      Non-bleeding external hemorrhoids were found during retroflexion. The       hemorrhoids were medium-sized.      The exam was otherwise without abnormality. Impression:            - Perianal skin tags found on perianal exam.                        - The examined portion of the ileum was normal.                        - One 5 mm polyp in the cecum, removed with mucosal                         resection. Resected and retrieved.                        - One 6  mm polyp in the ascending colon, removed with                         a cold snare. Resected and retrieved.                        - Non-bleeding external hemorrhoids.                        - The examination was otherwise normal.                        - Mucosal resection was performed. Resection and                         retrieval were complete. Recommendation:        - Discharge patient to home (with escort).                        - Resume previous diet today.                        - Continue present medications.                        - Await pathology results.                        - Repeat colonoscopy in 7 years for surveillance.                        - Return to my office as previously scheduled. Procedure Code(s):     --- Professional ---                        [phone], Colonoscopy, flexible; with endoscopic mucosal                          resection                        [id number], 46, Colonoscopy, flexible; with removal of                         tumor(s), polyp(s), or other lesion(s) by snare                         technique Diagnosis Code(s):     --- Professional ---                        K64.4, Residual hemorrhoidal skin tags                        Z12.11, Encounter for screening for malignant neoplasm                         of colon                        K63.5, Polyp of colon CPT copyright 2019 American Medical Association. All rights reserved. The codes documented in this report are preliminary and upon coder  review may  be revised to meet current compliance requirements. Dr. Ulyess Mort Lin Landsman MD, MD 10/05/2019 8:39:30 AM This report has been signed electronically. Number of Addenda: 0 Note Initiated On: 10/05/2019 7:27 AM Scope Withdrawal Time: 0 hours 16 minutes 53 seconds  Total Procedure Duration: 0 hours 22 minutes 58 seconds  Estimated Blood Loss:  Estimated blood loss: none.      [facility]

## 2019-10-05 NOTE — Op Note (Signed)
[facility] Gastroenterology Patient Name: Sharon Barnes Procedure Date: 10/05/2019 7:28 AM MRN: [id number] Account #: [id number] Date of Birth: Jan 08, 1966 Admit Type: Outpatient Age: 54 Room: [facility] OR ROOM 01 Gender: Female Note Status: Finalized Procedure:             Upper GI endoscopy Indications:           Suspected gastro-esophageal reflux disease Providers:             Lin Landsman MD, MD Referring MD:          Rosie Fate, MD (Referring MD) Medicines:             Monitored Anesthesia Care Complications:         No immediate complications. Estimated blood loss: None. Procedure:             Pre-Anesthesia Assessment:                        - Prior to the procedure, a History and Physical was                         performed, and patient medications and allergies were                         reviewed. The patient is competent. The risks and                         benefits of the procedure and the sedation options and                         risks were discussed with the patient. All questions                         were answered and informed consent was obtained.                         Patient identification and proposed procedure were                         verified by the physician, the nurse, the                         anesthesiologist, the anesthetist and the technician                         in the pre-procedure area in the procedure room in the                         endoscopy suite. Mental Status Examination: alert and                         oriented. Airway Examination: normal oropharyngeal                         airway and neck mobility. Respiratory Examination:                         clear to auscultation. CV Examination: normal.  Prophylactic Antibiotics: The patient does not require                         prophylactic antibiotics. Prior Anticoagulants: The                         patient has taken no  previous anticoagulant or                         antiplatelet agents. ASA Grade Assessment: II - A                         patient with mild systemic disease. After reviewing                         the risks and benefits, the patient was deemed in                         satisfactory condition to undergo the procedure. The                         anesthesia plan was to use monitored anesthesia care                         (MAC). Immediately prior to administration of                         medications, the patient was re-assessed for adequacy                         to receive sedatives. The heart rate, respiratory                         rate, oxygen saturations, blood pressure, adequacy of                         pulmonary ventilation, and response to care were                         monitored throughout the procedure. The physical                         status of the patient was re-assessed after the                         procedure.                        After obtaining informed consent, the endoscope was                         passed under direct vision. Throughout the procedure,                         the patient's blood pressure, pulse, and oxygen                         saturations were monitored continuously. The  Endosonoscope was introduced through the mouth, and                         advanced to the second part of duodenum. The upper GI                         endoscopy was accomplished without difficulty. The                         patient tolerated the procedure well. Findings:      The duodenal bulb and second portion of the duodenum were normal.      Diffuse mildly erythematous mucosa without bleeding was found in the       gastric body. Biopsies were taken with a cold forceps for Helicobacter       pylori testing.      The cardia and gastric fundus were normal on retroflexion.      Esophagogastric landmarks were identified: the  gastroesophageal junction       was found at 38 cm from the incisors.      The gastroesophageal junction and examined esophagus were normal.       Biopsies were taken with a cold forceps for histology. Impression:            - Normal duodenal bulb and second portion of the                         duodenum.                        - Erythematous mucosa in the gastric body. Biopsied.                        - Esophagogastric landmarks identified.                        - Normal gastroesophageal junction and esophagus.                         Biopsied. Recommendation:        - Await pathology results.                        - Follow an antireflux regimen today.                        - Use Prilosec (omeprazole) 40 mg PO BID for 8 weeks.                        - Return to my office as previously scheduled. Procedure Code(s):     --- Professional ---                        [phone], Esophagogastroduodenoscopy, flexible,                         transoral; with biopsy, single or multiple Diagnosis Code(s):     --- Professional ---                        K31.89, Other diseases of stomach and duodenum CPT copyright 2019 [employer] Association.  All rights reserved. The codes documented in this report are preliminary and upon coder review may  be revised to meet current compliance requirements. Dr. Ulyess Mort Lin Landsman MD, MD 10/05/2019 8:11:47 AM This report has been signed electronically. Number of Addenda: 0 Note Initiated On: 10/05/2019 7:28 AM Estimated Blood Loss:  Estimated blood loss: none.      [facility]

## 2019-10-05 NOTE — Transfer of Care (Signed)
Immediate Anesthesia Transfer of Care Note  Patient: Sharon Barnes  Procedure(s) Performed: ESOPHAGOGASTRODUODENOSCOPY (EGD) WITH PROPOFOL (N/A Throat) COLONOSCOPY WITH PROPOFOL (N/A Rectum) POLYPECTOMY (Rectum)  Patient Location: PACU  Anesthesia Type: General  Level of Consciousness: awake, alert  and patient cooperative  Airway and Oxygen Therapy: Patient Spontanous Breathing and Patient connected to supplemental oxygen  Post-op Assessment: Post-op Vital signs reviewed, Patient's Cardiovascular Status Stable, Respiratory Function Stable, Patent Airway and No signs of Nausea or vomiting  Post-op Vital Signs: Reviewed and stable  Complications: No apparent anesthesia complications

## 2019-10-05 NOTE — H&P (Signed)
Cephas Darby, MD [address]  [place]  [place], [place] [address]  Main: [phone]  Fax: [phone] Pager: [phone]  Primary Care Physician:  Idelle Crouch, MD Primary Gastroenterologist:  Dr. Cephas Darby  Pre-Procedure History & Physical: HPI:  Sharon Barnes is a 54 y.o. female is here for an endoscopy and colonoscopy.   Past Medical History:  Diagnosis Date  . Arthritis    knees  . DVT (deep venous thrombosis) ([place]) 07/15/2014   Right leg  . GERD (gastroesophageal reflux disease)   . Hyperlipidemia   . Lymphedema   . Motion sickness    circular motion  . Numbness of feet   . PONV (postoperative nausea and vomiting)    after hysterectomy  . Sleep apnea    CPAP    Past Surgical History:  Procedure Laterality Date  . ABDOMINAL HYSTERECTOMY    . CHOLECYSTECTOMY    . KNEE ARTHROSCOPY WITH MENISCAL REPAIR Right 07/24/2019   Procedure: KNEE ARTHROSCOPY WITH PARTIAL MENISECTOMY CHONDROPLASTY, PATELLOFEMORAL COMPARTMENT, [place]lica Removal;  Surgeon: Leim Fabry, MD;  Location: [facility];  Service: Orthopedics;  Laterality: Right;  sleep apnea  . NASAL SINUS SURGERY      Prior to Admission medications   Medication Sig Start Date End Date Taking? Authorizing Provider  acetaminophen (TYLENOL) 500 MG tablet Take 2 tablets (1,000 mg total) by mouth every 8 (eight) hours. 07/24/19 07/23/20 Yes Leim Fabry, MD  clonazePAM (KLONOPIN) 0.5 MG tablet Take by mouth as needed.  02/10/16  Yes [provider]  Cyanocobalamin (RA VITAMIN B-12 TR) 1000 MCG TBCR Take by mouth.   Yes [provider]  fluticasone (FLONASE) 50 MCG/ACT nasal spray Place into both nostrils 2 (two) times daily.   Yes [provider]  gabapentin (NEURONTIN) 100 MG capsule Take 100 mg by mouth 3 (three) times daily. Pt takes 300 mg at bedtime 06/01/19  Yes [provider]  Multiple Vitamin (MULTI-VITAMINS) TABS Take by mouth.   Yes [provider]  cetirizine (ZYRTEC) 10 MG tablet Take by mouth.    [provider]  HYDROcodone-acetaminophen (NORCO) 5-325 MG tablet Take 1-2 tablets by mouth every 4 (four) hours as needed for moderate pain or severe pain. Patient not taking: Reported on 09/14/2019 07/24/19   Leim Fabry, MD  ibuprofen (ADVIL) 800 MG tablet TAKE 1 TABLET BY MOUTH 3 TIMES A DAY FOR 14 DAYS 07/24/19   [provider]  ondansetron (ZOFRAN ODT) 4 MG disintegrating tablet Take 1 tablet (4 mg total) by mouth every 8 (eight) hours as needed for nausea or vomiting. Patient not taking: Reported on 09/14/2019 07/24/19   Leim Fabry, MD  traMADol (ULTRAM) 50 MG tablet Take 50 mg by mouth every 6 (six) hours as needed. 05/21/19   [provider]    Allergies as of 09/20/2019  . (No Known Allergies)    Family History  Problem Relation Age of Onset  . Heart disease Mother   . Cancer Father   . Heart disease Maternal Grandmother   . Cancer Maternal Grandfather   . Emphysema Paternal Grandmother     Social History   Socioeconomic History  . Marital status: Divorced    Spouse name: Not on file  . Number of children: Not on file  . Years of education: Not on file  . Highest education level: Not on file  Occupational History  . Not on file  Tobacco Use  . Smoking status: Never Smoker  .  Smokeless tobacco: Never Used  Substance and Sexual Activity  . Alcohol use: Yes    Comment: 1x/month  . Drug use: No  . Sexual activity: Not on file  Other Topics Concern  . Not on file  Social History Narrative  . Not on file   Social Determinants of Health   Financial Resource Strain:   . Difficulty of Paying Living Expenses: Not on file  Food Insecurity:   . Worried About Charity fundraiser in the Last Year: Not on file  . Ran Out of Food in the Last Year: Not on file  Transportation Needs:   . Lack of Transportation (Medical): Not on file  . Lack of Transportation (Non-Medical): Not  on file  Physical Activity:   . Days of Exercise per Week: Not on file  . Minutes of Exercise per Session: Not on file  Stress:   . Feeling of Stress : Not on file  Social Connections:   . Frequency of Communication with Friends and Family: Not on file  . Frequency of Social Gatherings with Friends and Family: Not on file  . Attends Religious Services: Not on file  . Active Member of Clubs or Organizations: Not on file  . Attends Archivist Meetings: Not on file  . Marital Status: Not on file  Intimate Partner Violence:   . Fear of Current or Ex-Partner: Not on file  . Emotionally Abused: Not on file  . Physically Abused: Not on file  . Sexually Abused: Not on file    Review of Systems: See HPI, otherwise negative ROS  Physical Exam: BP (!) 126/98   Pulse 78   Temp 98.1 F (36.7 C) (Temporal)   Resp 16   Ht 5\' 4"  (1.626 m)   Wt 99.8 kg   SpO2 98%   BMI 37.76 kg/m  General:   Alert,  pleasant and cooperative in NAD Head:  Normocephalic and atraumatic. Neck:  Supple; no masses or thyromegaly. Lungs:  Clear throughout to auscultation.    Heart:  Regular rate and rhythm. Abdomen:  Soft, nontender and nondistended. Normal bowel sounds, without guarding, and without rebound.   Neurologic:  Alert and  oriented x4;  grossly normal neurologically.  Impression/Plan: Sharon Barnes is here for an endoscopy and colonoscopy to be performed for chronic GERD and colon cancer screening  Risks, benefits, limitations, and alternatives regarding  endoscopy and colonoscopy have been reviewed with the patient.  Questions have been answered.  All parties agreeable.   Sherri Sear, MD  10/05/2019, 7:51 AM

## 2019-10-05 NOTE — Anesthesia Procedure Notes (Signed)
Procedure Name: MAC Date/Time: 10/05/2019 8:01 AM Performed by: Georga Bora, CRNA Pre-anesthesia Checklist: Patient identified, Emergency Drugs available, Suction available, Patient being monitored and Timeout performed Patient Re-evaluated:Patient Re-evaluated prior to induction Oxygen Delivery Method: Nasal cannula Preoxygenation: Pre-oxygenation with 100% oxygen Induction Type: IV induction

## 2019-10-05 NOTE — Anesthesia Preprocedure Evaluation (Addendum)
Anesthesia Evaluation  Patient identified by MRN, date of birth, ID band Patient awake    History of Anesthesia Complications (+) PONV and history of anesthetic complications  Airway Mallampati: IV  TM Distance: <3 FB Neck ROM: Full  Mouth opening: Limited Mouth Opening Comment: Small mouth opening Dental no notable dental hx.    Pulmonary sleep apnea ,    Pulmonary exam normal        Cardiovascular Exercise Tolerance: Good negative cardio ROS Normal cardiovascular exam     Neuro/Psych negative neurological ROS     GI/Hepatic negative GI ROS, Neg liver ROS,   Endo/Other  Morbid obesity  Renal/GU negative Renal ROS     Musculoskeletal negative musculoskeletal ROS (+)   Abdominal   Peds  Hematology Hx of DVT 6 years ago, no longer on [facility]   Anesthesia Other Findings   Reproductive/Obstetrics                             Anesthesia Physical Anesthesia Plan  ASA: II  Anesthesia Plan: General   Post-op Pain Management:    Induction:   PONV Risk Score and Plan: 4 or greater and Propofol infusion, TIVA and Treatment may vary due to age or medical condition  Airway Management Planned: Natural Airway and Nasal Cannula  Additional Equipment: None  Intra-op Plan:   Post-operative Plan:   Informed Consent: I have reviewed the patients History and Physical, chart, labs and discussed the procedure including the risks, benefits and alternatives for the proposed anesthesia with the patient or authorized representative who has indicated his/her understanding and acceptance.       Plan Discussed with: CRNA  Anesthesia Plan Comments:         Anesthesia Quick Evaluation

## 2019-10-08 ENCOUNTER — Encounter: Payer: Self-pay | Admitting: *Deleted

## 2019-10-09 LAB — SURGICAL PATHOLOGY

## 2019-10-11 ENCOUNTER — Encounter: Payer: Self-pay | Admitting: Gastroenterology

## 2019-10-15 ENCOUNTER — Encounter (INDEPENDENT_AMBULATORY_CARE_PROVIDER_SITE_OTHER): Payer: Self-pay

## 2019-10-17 ENCOUNTER — Other Ambulatory Visit (INDEPENDENT_AMBULATORY_CARE_PROVIDER_SITE_OTHER): Payer: Self-pay | Admitting: Nurse Practitioner

## 2019-10-17 DIAGNOSIS — I890 Lymphedema, not elsewhere classified: Secondary | ICD-10-CM

## 2019-10-17 NOTE — Telephone Encounter (Signed)
Can a referral be placed for the patient. Thank you

## 2019-10-21 ENCOUNTER — Ambulatory Visit: Payer: BC Managed Care – PPO

## 2019-10-25 ENCOUNTER — Other Ambulatory Visit: Payer: Self-pay

## 2019-10-25 ENCOUNTER — Ambulatory Visit: Payer: BC Managed Care – PPO | Attending: Nurse Practitioner | Admitting: Occupational Therapy

## 2019-10-25 ENCOUNTER — Encounter: Payer: Self-pay | Admitting: Occupational Therapy

## 2019-10-25 DIAGNOSIS — I890 Lymphedema, not elsewhere classified: Secondary | ICD-10-CM | POA: Diagnosis present

## 2019-10-25 NOTE — Patient Instructions (Signed)

## 2019-10-25 NOTE — Therapy (Signed)
[place] MAIN [facility] SERVICES [address] [place], [place], [address] Phone: [phone]   Fax:  [phone]  Occupational Therapy Evaluation  Patient Details  Name: Sharon Barnes MRN: [id number] Date of Birth: 31-Dec-1965 Referring Provider (OT): Eulogio Ditch, NP   Encounter Date: 10/25/2019  OT End of Session - 10/25/19 1542    Visit Number  1    Number of Visits  36    Date for OT Re-Evaluation  01/23/20    OT Start Time  0900    OT Stop Time  1015    OT Time Calculation (min)  75 min    Activity Tolerance  Patient tolerated treatment well;No increased pain    Behavior During Therapy  WFL for tasks assessed/performed       Past Medical History:  Diagnosis Date  . Arthritis    knees  . DVT (deep venous thrombosis) ([place]) 07/15/2014   Right leg  . GERD (gastroesophageal reflux disease)   . Hyperlipidemia   . Lymphedema   . Motion sickness    circular motion  . Numbness of feet   . PONV (postoperative nausea and vomiting)    after hysterectomy  . Sleep apnea    CPAP    Past Surgical History:  Procedure Laterality Date  . ABDOMINAL HYSTERECTOMY    . CHOLECYSTECTOMY    . COLONOSCOPY WITH PROPOFOL N/A 10/05/2019   Procedure: COLONOSCOPY WITH PROPOFOL;  Surgeon: Lin Landsman, MD;  Location: [place];  Service: Endoscopy;  Laterality: N/A;  . ESOPHAGOGASTRODUODENOSCOPY (EGD) WITH PROPOFOL N/A 10/05/2019   Procedure: ESOPHAGOGASTRODUODENOSCOPY (EGD) WITH PROPOFOL;  Surgeon: Lin Landsman, MD;  Location: [place];  Service: Endoscopy;  Laterality: N/A;  . KNEE ARTHROSCOPY WITH MENISCAL REPAIR Right 07/24/2019   Procedure: KNEE ARTHROSCOPY WITH PARTIAL MENISECTOMY CHONDROPLASTY, PATELLOFEMORAL COMPARTMENT, [place] Removal;  Surgeon: Leim Fabry, MD;  Location: [place];  Service: Orthopedics;  Laterality: Right;  sleep apnea  . NASAL SINUS SURGERY    . POLYPECTOMY  10/05/2019   Procedure:  POLYPECTOMY;  Surgeon: Lin Landsman, MD;  Location: [place];  Service: Endoscopy;;    There were no vitals filed for this visit.  Subjective Assessment - 10/25/19 0916    Subjective   Sharon Barnes is referred to Occupational Therapy by Eulogio Ditch, NP of [facility] for evaluation and treatment of BLE and LUE Lymphedema. Sharon Barnes reports she requested lymphedema consult as she feels her condition is worsening. Pt reports onset of leg swelling shortly s/p surgical removal of IUD and hysterectomy in 2016. Pt reports a R leg DVT at around the same time without known etiology. She reports exacerbation of R leg swelling after recent meniscus tear and repair.  Pt denies recurrent  DVT and is not curreently on anticoagulant.  Sharon Barnes reports onset of L arm and hand swelling at around the same time as leg swelling onset. She reports swelling in the arm and legs no longer fluctuates , and symptoms worsen as the day progresses. Pt denies known family history of leg swelling Pt has not previously undergone formal, "gold standard" LE rx known as Complete Decongestive Therapy. She does have an 8 chmber sequential pneumatic compression device ("pump"), which she uses 2 x daily for 1 hr each session. She does not have compression arm sleeve, glove or stockings. Pt's goals for OT and CDT are to learn about lymphedema, reduce sweklling and associated pain in her arm and  legs, and limit LE progression.    Pertinent History  LE relevant PMH: OSA, cpap nightly. has not been calibrated in > 2 yrs. R meniscus tear and repair. obesity,    Limitations  impaired functional ambulation and mobility;  impaired standing balance;  decreased LB muscle strength, decreased LE AROM;   chronic, progressive BLE and L arm swelling and associated pain; limited with basic and instrumental ADLs requiring standing and walking > 15 mintes; difficulty fitting street shoes and LB clothing and UB clothing,  difficulty lifting, reaching, carrying to perform self care, home management and productive/ work related activities    Repetition  Increases Symptoms    Special Tests  + stemer BLE and L hand    Patient Stated Goals  --    Currently in Pain?  Yes    Pain Location  Leg   not rated numerically today   Pain Orientation  Right;Left    Pain Descriptors / Indicators  Aching;Nagging;Tender;Shooting;Burning;Numbness;Pressure;Sore;Throbbing;Discomfort;Heaviness;Radiating;Tightness;Dull;Jabbing;Restless;Tingling;Tiring;Stabbing;Sharp;Pins and needles;Grimacing;Cramping    Pain Type  Chronic pain    Pain Onset  --   pain onset estimated feb 2017   Pain Frequency  Intermittent    Aggravating Factors   salt, sugar, heat, standing walking, extended        sitting    Pain Relieving Factors  elevation,    Effect of Pain on Daily Activities  swelling and swelling related pain limits activity level, decreased quality of life, lymphedema self care is time consuming, limits basic and instrumental ADLS, productive / work activities, leisure pursuits, social participation, role performance, body image and dself esteme        [facility] OT Assessment - 10/25/19 0001      Assessment   Medical Diagnosis  Mild , stage II, BLE lymphedema 2/2 unknown etiology. onset in late 40's Lymphedema tarda? LUE lymphedema, mild/moderate stage II, 2/2 unknown etiology.     Referring Provider (OT)  Eulogio Ditch, NP    Onset Date/Surgical Date  --   2016/2017   Prior Therapy  8 chamber biomed pump  uses 1 hr on legs and 1 hr day on arm at 50 mmHg. Compression arm  sleve, no LE compression stocking      Precautions   Precautions  Fall    Precaution Comments  LE skin precautions-elevated infection risl; Hx DVT LLE      Restrictions   Weight Bearing Restrictions  No      Balance Screen   Has the patient fallen in the past 6 months  No    Has the patient had a decrease in activity level because of a fear of falling?   Yes    Is  the patient reluctant to leave their home because of a fear of falling?   No      Home  Environment   Family/patient expects to be discharged to:  Private residence    Living Arrangements  Other relatives    Available Help at Discharge  Family    Type of [place]  One level    Alternate Level Stairs - Number of Steps  5 steps to enter w/ handrails    Bathroom Astronomer;Door    Child psychotherapist  Yes    How accessible  Accessible via walker    Lives With  Family      Prior Function   Level of  Independence  Independent   previously independent in all occupational domains   Vocation  Full time employment    Vocation Requirements  high school teacher- standing, sitting, walking, lifting, carrying    Leisure  family      IADL   Prior Level of Function Shopping  I    Shopping  Takes care of all shopping needs independently   requires seated rest breaks when > 1 hr   Prior Level of Function Light Housekeeping  I    Light Housekeeping  Does personal laundry completely;Launders small items, rinses stockings, etc.;Performs light daily tasks such as dishwashing, bed making;Performs light daily tasks but cannot maintain acceptable level of cleanliness   needs help w heavy house and yard work; requires seated rest   Prior Level of Function Meal Prep  I    Meal Prep  Plans, prepares and serves adequate meals independently   requires seated rest breaks when > 1 hr   Prior Level of Function Community Mobility  I    Investment banker, corporate own vehicle   long distance driving limited by leg and arm swelling + pain   Prior Level of Function Medication Managment  I      Mobility   Mobility Status  Independent      Activity Tolerance   Activity Tolerance  Endurance does not limit participation in activity    Activity Tolerance Comments  any activity requiring > 1 hr standing, walking and/or dependent  sitting  results in increased leg and arm swelling and increased swelliung-associated pain      Cognition   Overall Cognitive Status  Within Functional Limits for tasks assessed      Observation/Other Assessments   Observations  RLE swelling > LLE. Swelling  distributed from base of toes to groin on R and primarily to knee on L. Positive stemmer BLE and L hand base of fingers. Skin cool and blanching. Non pitting with increased palpable tissue density and fibrosis. Color WNL both UE and LE. Skin temp and hydration WNL. No signs/ sympt9oms of infection. Nails WNL. Lymphorrhea and hyperkeritosis absent.    Outcome Measures  BLE, LUE comparative limb volumetrics; Lymphedema Life Impact Scale; Lower extremity F7unctional Scale      Posture/Postural Control   Posture/Postural Control  No significant limitations      Sensation   Light Touch  Appears Intact      Coordination   Gross Motor Movements are Fluid and Coordinated  Yes    Fine Motor Movements are Fluid and Coordinated  Yes      ROM / Strength   AROM / PROM / Strength  AROM;Strength      AROM   Overall AROM   Deficits    AROM Assessment Site  Shoulder;Elbow;Wrist;Finger;Hip;Knee;Ankle      Strength   Overall Strength  Deficits    Strength Assessment Site  Knee;Hip;Ankle      Hand Function   Right Hand Gross Grasp  Functional    Left Hand Gross Grasp  Functional       LYMPHEDEMA/ONCOLOGY QUESTIONNAIRE - 10/25/19 0940      What other symptoms do you have   Are you Having Heaviness or Tightness  Yes    Are you having Pain  Yes    Are you having pitting edema  No    Is it Hard or Difficult finding clothes that fit  Yes    Do you have infections  No    Stemmer Sign  Yes      Lymphedema Stage   Stage  STAGE 2 SPONTANEOUSLY IRREVERSIBLE      Lymphedema Assessments   Lymphedema Assessments  Lower extremities;Upper extremities      Right Upper Extremity Lymphedema   Other  TBA      Left Upper Extremity Lymphedema    Other  TBA      Right Lower Extremity Lymphedema   Other  TBA      Left Lower Extremity Lymphedema   Other  TBA             OT Treatments/Exercises (OP) - 10/25/19 0001      Bed Mobility   Bed Mobility  Sitting - Scoot to Edge of Bed;Rolling Right;Rolling Left      Transfers   Transfers  Sit to Stand;Stand to Sit      ADLs   Overall ADLs  all basic and instrumental ADLs requiring standing, walking and /or extended sitting limited by increased leg abd arm swelling and associa5ted paiin    Grooming  limited    UB Dressing  limited    LB Dressing  limited    Bathing  limited    Functional Mobility  limited    Cooking  limited    Home Maintenance  limited    Writing  limited    Driving  limited    Work  limited    Leisure  limited    ADL Education Given  Yes      Manual Therapy   Manual Therapy  Edema management            OT Education - 10/25/19 1541    Education Details  Provided Pt and family education regarding lymphatic structure and function, etiologies, onset patterns and stages of progression. Discussed  impact of obesity on lymphatic function. Outlined Complete Decongestive Therapy (CDT)  as standard of care and provided in depth information regarding 4 primary components of both Intensive and Self Management Phases, including Manual Lymph Drainage (MLD), compression wrapping and garments, skin care, and therapeutic exercise.   Sharon Barnes discussion of high burden of care,  Discussed  Importance of daily, ongoing LE self-care essential to retaining clinical gains and limiting progression.  Lastly, reviewed lymphedema precautions, including cellulitis risk and difficulty with wound healing. Provided printed Lymphedema Workbook for reference.    Person(s) Educated  Patient    Methods  Explanation;Demonstration    Comprehension  Verbalized understanding;Returned demonstration;Need further instruction          OT Long Term Goals - 10/25/19 1605      OT LONG  TERM GOAL #1   Title  Pt will be able to verbalize signs and symptoms of cellulitis infection and identify 4 common lymphedema precautions using printed resource for reference (modified independence) with Max assist from spouse to limit LE progression over time.    Baseline  Max A    Time  4    Period  Days    Status  New      OT LONG TERM GOAL #2   Title  to ensure optimal limb volume reduction, to limit infection risk and to limit LE progression.Pt able to apply short stretch, multi-layered compression wraps using correct gradient techniques with modified independence (extra time) after skilled training for lymphedema self care to achieve limb volume reduction and limit progression of lymphedema.    Baseline  dependent    Time  4    Period  Days    Status  New    Target Date  --   4th OT visit for leg; 20th OT visit for UE     OT LONG TERM GOAL #3   Title  Pt to achieve at least 10% RLE limb volume reduction in BLE below the knees, and 10%limb volume reduction in LUE during Intensive Phase CDT to improve safe functional mobility and ambulation, to improve functional performance of basic and instrumental ADLs, to limit infection risk and chronic LE progression.    Baseline  Max A    Time  12    Period  Weeks    Status  New    Target Date  01/23/20      OT LONG TERM GOAL #4   Title  Pt will achieve and sustain at least 85%  compliance with daily LE self-care home program (skin care, lymphatic pumping therex, compression and simple self-MLD) during Intensive Phase CDT to limit limb swelling, reduce infection risk and limit LE progression.    Baseline  Max A    Time  12    Period  Weeks    Status  New    Target Date  01/23/20      OT LONG TERM GOAL #5   Title  Once issued Pt will be able to don and doff appropriate compression garments and/ or devices using correct techniques and assistive devices with modified independence and extra time  for optimal LE management to limit progression  over time.    Baseline  Max A    Time  12    Period  Weeks    Status  New    Target Date  01/23/20      Long Term Additional Goals   Additional Long Term Goals  Yes      OT LONG TERM GOAL #6   Title  During self-management phase of CDT Pt will retain limb volume reductions achieved during Intensive Phase CDT with no more than 3% volume increase to limit LE progression and further functional decline.    Baseline  Max A    Time  6    Period  Months    Status  New    Target Date  04/22/20            Plan - 10/25/19 1543    Clinical Impression Statement  Sharon Barnes is a 11 y o female presenting with mild, stage II, BLE lymphedema and mild, stage II , LUE lymphedema of unknown etiology. Onset of upper and lower extremity swelling and associated pain was ~ 2016 shortly after surgical removal of an IUD followed by hysterectomy. Swelling in 3 limbs significantly limits functional performance in all occupational domains, including basic and instrumental ADLs, work and productive activities, leisure pursuits and social participation at home and in the community. It negatively impacts Sharon Barnes quality of life and LE self-care requires ~ 5 hours of self-care daily, which is a time investment most people cannot make long term. Sharon Barnes will benefit from skilled  OT for both Intensive and Management Phase Complete Decongestive Therapy (CDT) to include MLD, skin care, compression wraps and garments, therapeutic exercise fo5 lymphatic pumping, and extensive LE self-care training. Pt may benefit from a thorough cardiovascular work up to rule out other potential etiologies as non-cancer -related swelling in 3 of 4 limbs simultaneously is relatively rare in this therapist's experience.  Pt may benefit from fitting with and advanced, 32-chambered sequential pneumatic compression device that addresses inguinal LN, axillary LN and  abdominal lymphatics for full return to the heart. Without skilled OT  for lymphedema care, this chronic, progressive condition is expected to worsen and Pt will experience further functional decline.    OT Occupational Profile and History  Comprehensive Assessment- Review of records and extensive additional review of physical, cognitive, psychosocial history related to current functional performance    Occupational performance deficits (Please refer to evaluation for details):  ADL's;Work;IADL's;Rest and Sleep;Leisure;Social Participation   body image, self esteme,  quality of life   Body Structure / Function / Physical Skills  ADL;Dexterity;Flexibility;ROM;IADL;Edema;Balance;Decreased knowledge of precautions;Gait;Pain;UE functional use;Decreased knowledge of use of DME;Mobility;Skin integrity    Psychosocial Skills  Habits;Routines and Behaviors;Coping Strategies    Rehab Potential  Good    Clinical Decision Making  Several treatment options, min-mod task modification necessary    Comorbidities Affecting Occupational Performance:  Presence of comorbidities impacting occupational performance    Comorbidities impacting occupational performance description:  See SUBJECTIVE and H&P in record    Modification or Assistance to Complete Evaluation   Min-Moderate modification of tasks or assist with assess necessary to complete eval    OT Frequency  2x / week    OT Duration  12 weeks   limbs treated separately to optimize function and limit falls. 3 limbs involved in this case will require long term lymphedema Intensive and Management Phase CDT > 12 weeks and closer to 1 year  consecutively   OT Treatment/Interventions  Self-care/ADL training;Therapeutic exercise;Manual lymph drainage;Therapeutic activities;Coping strategies training;DME and/or AE instruction;Manual Therapy;Compression bandaging;Patient/family education    Plan  Intensive Phase CDT to each limb consecutively. CDT includes manual lymphatic drainage (MLD), skin care, ther ex and compression therapy, both sh0rt  stretch gradient wraps and then medical grade cmopression garments and devices, including ADVANCED sequential pneumatic compression devices including treatment to regional LN and abdominal lymphatics    Recommended Other Services  fit with custom BLE compression stockings, arm sleeve and glove    Consulted and Agree with Plan of Care  Patient       Patient will benefit from skilled therapeutic intervention in order to improve the following deficits and impairments:   Body Structure / Function / Physical Skills: ADL, Dexterity, Flexibility, ROM, IADL, Edema, Balance, Decreased knowledge of precautions, Gait, Pain, UE functional use, Decreased knowledge of use of DME, Mobility, Skin integrity   Psychosocial Skills: Habits, Routines and Behaviors, Coping Strategies   Visit Diagnosis: Lymphedema, not elsewhere classified - Plan: Ot plan of care cert/re-cert    Problem List Patient Active Problem List   Diagnosis Date Noted  . Chronic GERD   . Colon cancer screening   . Migraine headache 02/17/2017  . Swelling of limb 06/28/2016  . Lymphedema 06/28/2016  . Pain in limb 06/28/2016  . Hyperglycemia 02/03/2015  . DVT of lower limb, acute ([place]) 01/08/2014  . High risk medication use 01/08/2014  . Hyperlipidemia 01/08/2014  . OSA (obstructive sleep apnea) 01/08/2014    Andrey Spearman, MS, OTR/L, [facility] 10/25/19 4:11 PM  [place] MAIN [facility] SERVICES [address] [place], [place], [address] Phone: [phone]   Fax:  [phone]  Name: RYNEISHA DEBSKI MRN: [id number] Date of Birth: 05-Apr-1966

## 2019-10-30 ENCOUNTER — Ambulatory Visit: Payer: BC Managed Care – PPO | Admitting: Gastroenterology

## 2019-11-02 ENCOUNTER — Ambulatory Visit: Payer: BC Managed Care – PPO | Admitting: Occupational Therapy

## 2019-11-02 ENCOUNTER — Other Ambulatory Visit: Payer: Self-pay

## 2019-11-02 DIAGNOSIS — I890 Lymphedema, not elsewhere classified: Secondary | ICD-10-CM

## 2019-11-02 NOTE — Patient Instructions (Signed)

## 2019-11-02 NOTE — Therapy (Signed)
[place] MAIN [facility] SERVICES [address] [place], [place], [address] Phone: [phone]   Fax:  [phone]  Occupational Therapy Treatment  Patient Details  Name: Sharon Barnes MRN: [id number] Date of Birth: 1965/12/27 Referring Provider (OT): Eulogio Ditch, NP   Encounter Date: 11/02/2019  OT End of Session - 11/02/19 1036    Visit Number  2    Number of Visits  36    Date for OT Re-Evaluation  01/23/20    OT Start Time  0805    OT Stop Time  0910    OT Time Calculation (min)  65 min       Past Medical History:  Diagnosis Date  . Arthritis    knees  . DVT (deep venous thrombosis) ([place]) 07/15/2014   Right leg  . GERD (gastroesophageal reflux disease)   . Hyperlipidemia   . Lymphedema   . Motion sickness    circular motion  . Numbness of feet   . PONV (postoperative nausea and vomiting)    after hysterectomy  . Sleep apnea    CPAP    Past Surgical History:  Procedure Laterality Date  . ABDOMINAL HYSTERECTOMY    . CHOLECYSTECTOMY    . COLONOSCOPY WITH PROPOFOL N/A 10/05/2019   Procedure: COLONOSCOPY WITH PROPOFOL;  Surgeon: Lin Landsman, MD;  Location: [place];  Service: Endoscopy;  Laterality: N/A;  . ESOPHAGOGASTRODUODENOSCOPY (EGD) WITH PROPOFOL N/A 10/05/2019   Procedure: ESOPHAGOGASTRODUODENOSCOPY (EGD) WITH PROPOFOL;  Surgeon: Lin Landsman, MD;  Location: [place];  Service: Endoscopy;  Laterality: N/A;  . KNEE ARTHROSCOPY WITH MENISCAL REPAIR Right 07/24/2019   Procedure: KNEE ARTHROSCOPY WITH PARTIAL MENISECTOMY CHONDROPLASTY, PATELLOFEMORAL COMPARTMENT, Plica Removal;  Surgeon: Leim Fabry, MD;  Location: [place];  Service: Orthopedics;  Laterality: Right;  sleep apnea  . NASAL SINUS SURGERY    . POLYPECTOMY  10/05/2019   Procedure: POLYPECTOMY;  Surgeon: Lin Landsman, MD;  Location: [place];  Service: Endoscopy;;    There were no vitals filed for  this visit.  Subjective Assessment - 11/02/19 0819    Subjective   Amauri Helbling presents for OT visit 1 / 36 to address BLE lymphedema. Pt is feeling a little overwhelmed with chronic progressive nature of LE diagnosis. Pt denies pain in her legs this morning, but last night pain in legs and feet 3/10. "It was keeping me from going to sleep. I can feel when the swelling in my knee goes up. Standing in one place is all it takes."    Pertinent History  LE relevant PMH: OSA, cpap nightly. has not been calibrated in > 2 yrs. R meniscus tear and repair. obesity,    Limitations  impaired functional ambulation and mobility;  impaired standing balance;  decreased LB muscle strength, decreased LE AROM;   chronic, progressive BLE and L arm swelling and associated pain; limited with basic and instrumental ADLs requiring standing and walking > 15 mintes; difficulty fitting street shoes and LB clothing and UB clothing, difficulty lifting, reaching, carrying to perform self care, home management and productive/ work related activities    Repetition  Increases Symptoms    Special Tests  + stemer BLE and L hand    Pain Onset  --   pain onset estimated feb 2017         LYMPHEDEMA/ONCOLOGY QUESTIONNAIRE - 11/02/19 1030      Right Lower Extremity Lymphedema   Other  RLE ankle to tibial  tuberosity (A-D) limb volume = 4558.84 ml. RLE knee to groin (E-G) volume = 5097.09 ml. RLE full leg, ankle to groin (A-G) volume = 9655.94 ml    Other  Limb volume differential for leg (A-D) = 5%, R>L. LVD for thigh (E-G) = 1.2%, L>R. LVD for full leg (A-G) = 1.73%, R>L. R limb is dominant and most painful by report.       Left Lower Extremity Lymphedema   Other  LLE ankle to tibial tuberosity (A-D) limb volume = 9490.52 ml. LLE knee to groin (E-G) volume = 5159.75 ml. LLE full leg, ankle to groin (A-G) volume = 9490.52 ml              OT Treatments/Exercises (OP) - 11/02/19 0001      ADLs   ADL Education Given   Yes      Manual Therapy   Manual Therapy  Edema management;Compression Bandaging    Edema Management  BLE comparative limb volumetrics     Compression Bandaging  Wrap RLE using gradient compression techniques to the knee with 8, 10, and 12 cm short stretch bandages over single layer of 0.4 cm Rosidal foam and stockinett.             OT Education - 11/02/19 1029    Education Details  Pt edu for purpose and outcome of initial comparative limb volumetrics, and into level comn0ression therpy edu    Person(s) Educated  Patient    Methods  Explanation;Demonstration    Comprehension  Verbalized understanding;Returned demonstration;Need further instruction          OT Long Term Goals - 10/25/19 1605      OT LONG TERM GOAL #1   Title  Pt will be able to verbalize signs and symptoms of cellulitis infection and identify 4 common lymphedema precautions using printed resource for reference (modified independence) with Max assist from spouse to limit LE progression over time.    Baseline  Max A    Time  4    Period  Days    Status  New      OT LONG TERM GOAL #2   Title  to ensure optimal limb volume reduction, to limit infection risk and to limit LE progression.Pt able to apply short stretch, multi-layered compression wraps using correct gradient techniques with modified independence (extra time) after skilled training for lymphedema self care to achieve limb volume reduction and limit progression of lymphedema.    Baseline  dependent    Time  4    Period  Days    Status  New    Target Date  --   4th OT visit for leg; 20th OT visit for UE     OT LONG TERM GOAL #3   Title  Pt to achieve at least 10% RLE limb volume reduction in BLE below the knees, and 10%limb volume reduction in LUE during Intensive Phase CDT to improve safe functional mobility and ambulation, to improve functional performance of basic and instrumental ADLs, to limit infection risk and chronic LE progression.     Baseline  Max A    Time  12    Period  Weeks    Status  New    Target Date  01/23/20      OT LONG TERM GOAL #4   Title  Pt will achieve and sustain at least 85%  compliance with daily LE self-care home program (skin care, lymphatic pumping therex, compression and simple self-MLD) during Intensive Phase  CDT to limit limb swelling, reduce infection risk and limit LE progression.    Baseline  Max A    Time  12    Period  Weeks    Status  New    Target Date  01/23/20      OT LONG TERM GOAL #5   Title  Once issued Pt will be able to don and doff appropriate compression garments and/ or devices using correct techniques and assistive devices with modified independence and extra time  for optimal LE management to limit progression over time.    Baseline  Max A    Time  12    Period  Weeks    Status  New    Target Date  01/23/20      Long Term Additional Goals   Additional Long Term Goals  Yes      OT LONG TERM GOAL #6   Title  During self-management phase of CDT Pt will retain limb volume reductions achieved during Intensive Phase CDT with no more than 3% volume increase to limit LE progression and further functional decline.    Baseline  Max A    Time  6    Period  Months    Status  New    Target Date  04/22/20            Plan - 11/02/19 1037    Clinical Impression Statement  Completed comprative limb volumetrics to establish beginning limb volumes. Limb volume differential for leg (A-D) = 5%, R>L. LVD for thigh (E-G) = 1.2%, L>R. LVD for full leg (A-G) = 1.73%, R>L. R limb is dominant and most painful by report. Applied multilayer gradient comrpession wraps from base of toes to popliteal fossa to RLE. Pt fully engaged ibn all LE self care edu . Cont as per POC.       Patient will benefit from skilled therapeutic intervention in order to improve the following deficits and impairments:           Visit Diagnosis: Lymphedema, not elsewhere classified    Problem  List Patient Active Problem List   Diagnosis Date Noted  . Chronic GERD   . Colon cancer screening   . Migraine headache 02/17/2017  . Swelling of limb 06/28/2016  . Lymphedema 06/28/2016  . Pain in limb 06/28/2016  . Hyperglycemia 02/03/2015  . DVT of lower limb, acute ([place]) 01/08/2014  . High risk medication use 01/08/2014  . Hyperlipidemia 01/08/2014  . OSA (obstructive sleep apnea) 01/08/2014    Andrey Spearman, MS, OTR/L, [facility] 11/02/19 10:39 AM  [place] MAIN [facility] SERVICES [address] [place], [place], [address] Phone: [phone]   Fax:  [phone]  Name: KARYLE ANGLEN MRN: [id number] Date of Birth: 14-Sep-1965

## 2019-11-06 ENCOUNTER — Other Ambulatory Visit: Payer: Self-pay

## 2019-11-06 ENCOUNTER — Ambulatory Visit: Payer: BC Managed Care – PPO | Admitting: Occupational Therapy

## 2019-11-06 DIAGNOSIS — I890 Lymphedema, not elsewhere classified: Secondary | ICD-10-CM | POA: Diagnosis not present

## 2019-11-06 NOTE — Therapy (Signed)
[place] MAIN [facility] SERVICES [address] [place], [place], [address] Phone: [phone]   Fax:  [phone]  Occupational Therapy Treatment  Patient Details  Name: Sharon Barnes MRN: [id number] Date of Birth: Dec 08, 1965 Referring Provider (OT): Eulogio Ditch, NP   Encounter Date: 11/06/2019  OT End of Session - 11/06/19 1533    Visit Number  3    Number of Visits  36    Date for OT Re-Evaluation  01/23/20    OT Start Time  0100    OT Stop Time  0205    OT Time Calculation (min)  65 min    Activity Tolerance  Patient tolerated treatment well;No increased pain    Behavior During Therapy  WFL for tasks assessed/performed       Past Medical History:  Diagnosis Date  . Arthritis    knees  . DVT (deep venous thrombosis) ([place]) 07/15/2014   Right leg  . GERD (gastroesophageal reflux disease)   . Hyperlipidemia   . Lymphedema   . Motion sickness    circular motion  . Numbness of feet   . PONV (postoperative nausea and vomiting)    after hysterectomy  . Sleep apnea    CPAP    Past Surgical History:  Procedure Laterality Date  . ABDOMINAL HYSTERECTOMY    . CHOLECYSTECTOMY    . COLONOSCOPY WITH PROPOFOL N/A 10/05/2019   Procedure: COLONOSCOPY WITH PROPOFOL;  Surgeon: Lin Landsman, MD;  Location: [place];  Service: Endoscopy;  Laterality: N/A;  . ESOPHAGOGASTRODUODENOSCOPY (EGD) WITH PROPOFOL N/A 10/05/2019   Procedure: ESOPHAGOGASTRODUODENOSCOPY (EGD) WITH PROPOFOL;  Surgeon: Lin Landsman, MD;  Location: [place];  Service: Endoscopy;  Laterality: N/A;  . KNEE ARTHROSCOPY WITH MENISCAL REPAIR Right 07/24/2019   Procedure: KNEE ARTHROSCOPY WITH PARTIAL MENISECTOMY CHONDROPLASTY, PATELLOFEMORAL COMPARTMENT, Plica Removal;  Surgeon: Leim Fabry, MD;  Location: [place];  Service: Orthopedics;  Laterality: Right;  sleep apnea  . NASAL SINUS SURGERY    . POLYPECTOMY  10/05/2019   Procedure:  POLYPECTOMY;  Surgeon: Lin Landsman, MD;  Location: [place];  Service: Endoscopy;;    There were no vitals filed for this visit.  Subjective Assessment - 11/06/19 1524    Subjective   [name] presents for OT Rxv visit 3/ 36 to address BLE lymphedema. Pt reports she was able to tolerate knee length cmopression wraps to RLE the full 23 hours , but she did feel some pain at anterior ankle and lateral leg after several hours. Pt and OT discussed how to build tolerance for compression wraps to decrease sensativity and discomfort for optimal outcome. Pt given multiple strategies for building tolerance.    Pertinent History  LE relevant PMH: OSA, cpap nightly. has not been calibrated in > 2 yrs. R meniscus tear and repair. obesity,    Limitations  impaired functional ambulation and mobility;  impaired standing balance;  decreased LB muscle strength, decreased LE AROM;   chronic, progressive BLE and L arm swelling and associated pain; limited with basic and instrumental ADLs requiring standing and walking > 15 mintes; difficulty fitting street shoes and LB clothing and UB clothing, difficulty lifting, reaching, carrying to perform self care, home management and productive/ work related activities    Repetition  Increases Symptoms    Special Tests  + stemer BLE and L hand    Currently in Pain?  Yes   unchanged from initial eval (last visit)   Pain  Onset  --   pain onset estimated feb 2017                  OT Treatments/Exercises (OP) - 11/06/19 0001      ADLs   ADL Education Given  Yes      Manual Therapy   Manual Therapy  Edema management    Compression Bandaging  Wrap RLE using gradient compression techniques to the knee with 8, 10, and 12 cm short stretch bandages over single layer of 0.4 cm Rosidal foam and stockinett.             OT Education - 11/06/19 1532    Education Details  Pt edu for gradient compression wrapping    Person(s) Educated   Patient    Methods  Explanation;Demonstration;Tactile cues;Verbal cues    Comprehension  Verbalized understanding;Returned demonstration;Need further instruction          OT Long Term Goals - 10/25/19 1605      OT LONG TERM GOAL #1   Title  Pt will be able to verbalize signs and symptoms of cellulitis infection and identify 4 common lymphedema precautions using printed resource for reference (modified independence) with Max assist from spouse to limit LE progression over time.    Baseline  Max A    Time  4    Period  Days    Status  New      OT LONG TERM GOAL #2   Title  to ensure optimal limb volume reduction, to limit infection risk and to limit LE progression.Pt able to apply short stretch, multi-layered compression wraps using correct gradient techniques with modified independence (extra time) after skilled training for lymphedema self care to achieve limb volume reduction and limit progression of lymphedema.    Baseline  dependent    Time  4    Period  Days    Status  New    Target Date  --   4th OT visit for leg; 20th OT visit for UE     OT LONG TERM GOAL #3   Title  Pt to achieve at least 10% RLE limb volume reduction in BLE below the knees, and 10%limb volume reduction in LUE during Intensive Phase CDT to improve safe functional mobility and ambulation, to improve functional performance of basic and instrumental ADLs, to limit infection risk and chronic LE progression.    Baseline  Max A    Time  12    Period  Weeks    Status  New    Target Date  01/23/20      OT LONG TERM GOAL #4   Title  Pt will achieve and sustain at least 85%  compliance with daily LE self-care home program (skin care, lymphatic pumping therex, compression and simple self-MLD) during Intensive Phase CDT to limit limb swelling, reduce infection risk and limit LE progression.    Baseline  Max A    Time  12    Period  Weeks    Status  New    Target Date  01/23/20      OT LONG TERM GOAL #5   Title   Once issued Pt will be able to don and doff appropriate compression garments and/ or devices using correct techniques and assistive devices with modified independence and extra time  for optimal LE management to limit progression over time.    Baseline  Max A    Time  12    Period  Weeks  Status  New    Target Date  01/23/20      Long Term Additional Goals   Additional Long Term Goals  Yes      OT LONG TERM GOAL #6   Title  During self-management phase of CDT Pt will retain limb volume reductions achieved during Intensive Phase CDT with no more than 3% volume increase to limit LE progression and further functional decline.    Baseline  Max A    Time  6    Period  Months    Status  New    Target Date  04/22/20            Plan - 11/06/19 1538    Clinical Impression Statement  Provided skilled teaching for wrapping swollen leg using short stretch wraps and gradient techniques. After skilled training, including verbal and tactile cues , demonstrations and multiple opportunities to practice. Pt able to apply knee length wraps correctly with mod A. Next visit we'll commence MLD. Cont as epr POC.       Patient will benefit from skilled therapeutic intervention in order to improve the following deficits and impairments:           Visit Diagnosis: Lymphedema, not elsewhere classified    Problem List Patient Active Problem List   Diagnosis Date Noted  . Chronic GERD   . Colon cancer screening   . Migraine headache 02/17/2017  . Swelling of limb 06/28/2016  . Lymphedema 06/28/2016  . Pain in limb 06/28/2016  . Hyperglycemia 02/03/2015  . DVT of lower limb, acute ([place]) 01/08/2014  . High risk medication use 01/08/2014  . Hyperlipidemia 01/08/2014  . OSA (obstructive sleep apnea) 01/08/2014    Andrey Spearman, MS, OTR/L, [facility] 11/06/19 3:42 PM  [place] MAIN [facility] SERVICES [address] [place], [place], [address] Phone:  [phone]   Fax:  [phone]  Name: AYRIANNA MCDERMITT MRN: [id number] Date of Birth: 01-12-1966

## 2019-11-08 ENCOUNTER — Encounter: Payer: BC Managed Care – PPO | Admitting: Occupational Therapy

## 2019-11-09 ENCOUNTER — Other Ambulatory Visit: Payer: Self-pay

## 2019-11-09 ENCOUNTER — Ambulatory Visit: Payer: BC Managed Care – PPO | Admitting: Occupational Therapy

## 2019-11-09 DIAGNOSIS — I890 Lymphedema, not elsewhere classified: Secondary | ICD-10-CM | POA: Diagnosis not present

## 2019-11-09 NOTE — Therapy (Signed)
[place] MAIN [facility] SERVICES [address] [place], [place], [address] Phone: [phone]   Fax:  [phone]  Occupational Therapy Treatment  Patient Details  Name: Sharon Barnes MRN: [id number] Date of Birth: Jun 30, 1966 Referring Provider (OT): Eulogio Ditch, NP   Encounter Date: 11/09/2019  OT End of Session - 11/09/19 0916    Visit Number  4    Number of Visits  36    Date for OT Re-Evaluation  01/23/20    OT Start Time  0807    OT Stop Time  0905    OT Time Calculation (min)  58 min    Activity Tolerance  Patient tolerated treatment well;No increased pain    Behavior During Therapy  WFL for tasks assessed/performed       Past Medical History:  Diagnosis Date  . Arthritis    knees  . DVT (deep venous thrombosis) ([place]) 07/15/2014   Right leg  . GERD (gastroesophageal reflux disease)   . Hyperlipidemia   . Lymphedema   . Motion sickness    circular motion  . Numbness of feet   . PONV (postoperative nausea and vomiting)    after hysterectomy  . Sleep apnea    CPAP    Past Surgical History:  Procedure Laterality Date  . ABDOMINAL HYSTERECTOMY    . CHOLECYSTECTOMY    . COLONOSCOPY WITH PROPOFOL N/A 10/05/2019   Procedure: COLONOSCOPY WITH PROPOFOL;  Surgeon: Lin Landsman, MD;  Location: [place];  Service: Endoscopy;  Laterality: N/A;  . ESOPHAGOGASTRODUODENOSCOPY (EGD) WITH PROPOFOL N/A 10/05/2019   Procedure: ESOPHAGOGASTRODUODENOSCOPY (EGD) WITH PROPOFOL;  Surgeon: Lin Landsman, MD;  Location: [place];  Service: Endoscopy;  Laterality: N/A;  . KNEE ARTHROSCOPY WITH MENISCAL REPAIR Right 07/24/2019   Procedure: KNEE ARTHROSCOPY WITH PARTIAL MENISECTOMY CHONDROPLASTY, PATELLOFEMORAL COMPARTMENT, Plica Removal;  Surgeon: Leim Fabry, MD;  Location: [place];  Service: Orthopedics;  Laterality: Right;  sleep apnea  . NASAL SINUS SURGERY    . POLYPECTOMY  10/05/2019   Procedure:  POLYPECTOMY;  Surgeon: Lin Landsman, MD;  Location: [place];  Service: Endoscopy;;    There were no vitals filed for this visit.  Subjective Assessment - 11/09/19 0913    Subjective   Britni Plett presents for OT Rxv visit 4/ 36 to address BLE lymphedema. Pt reports tolerance for compression wraps is improving and she is able to wrap now in about 20 minutes vs 1 hr.    Pertinent History  LE relevant PMH: OSA, cpap nightly. has not been calibrated in > 2 yrs. R meniscus tear and repair. obesity,    Limitations  impaired functional ambulation and mobility;  impaired standing balance;  decreased LB muscle strength, decreased LE AROM;   chronic, progressive BLE and L arm swelling and associated pain; limited with basic and instrumental ADLs requiring standing and walking > 15 mintes; difficulty fitting street shoes and LB clothing and UB clothing, difficulty lifting, reaching, carrying to perform self care, home management and productive/ work related activities    Repetition  Increases Symptoms    Special Tests  + stemer BLE and L hand    Pain Onset  --   pain onset estimated feb 2017                  OT Treatments/Exercises (OP) - 11/09/19 0001      ADLs   ADL Education Given  Yes      Manual  Therapy   Manual Therapy  Edema management;Manual Lymphatic Drainage (MLD)    Manual Lymphatic Drainage (MLD)  MLD to RLE/RLQ using short neck sequence, deep abdominal pathways, functional inguinals  and leg strokes. Pt tolerated without difficulty.    Compression Bandaging  Wrap RLE using gradient compression techniques to the knee with 8, 10, and 12 cm short stretch bandages over single layer of 0.4 cm Rosidal foam and stockinett.             OT Education - 11/09/19 0915    Education Details  Pt edu for simple self MLD and reviewed compression wrapping    Person(s) Educated  Patient    Methods  Explanation;Demonstration;Tactile cues;Verbal cues     Comprehension  Verbalized understanding;Returned demonstration;Need further instruction          OT Long Term Goals - 10/25/19 1605      OT LONG TERM GOAL #1   Title  Pt will be able to verbalize signs and symptoms of cellulitis infection and identify 4 common lymphedema precautions using printed resource for reference (modified independence) with Max assist from spouse to limit LE progression over time.    Baseline  Max A    Time  4    Period  Days    Status  New      OT LONG TERM GOAL #2   Title  to ensure optimal limb volume reduction, to limit infection risk and to limit LE progression.Pt able to apply short stretch, multi-layered compression wraps using correct gradient techniques with modified independence (extra time) after skilled training for lymphedema self care to achieve limb volume reduction and limit progression of lymphedema.    Baseline  dependent    Time  4    Period  Days    Status  New    Target Date  --   4th OT visit for leg; 20th OT visit for UE     OT LONG TERM GOAL #3   Title  Pt to achieve at least 10% RLE limb volume reduction in BLE below the knees, and 10%limb volume reduction in LUE during Intensive Phase CDT to improve safe functional mobility and ambulation, to improve functional performance of basic and instrumental ADLs, to limit infection risk and chronic LE progression.    Baseline  Max A    Time  12    Period  Weeks    Status  New    Target Date  01/23/20      OT LONG TERM GOAL #4   Title  Pt will achieve and sustain at least 85%  compliance with daily LE self-care home program (skin care, lymphatic pumping therex, compression and simple self-MLD) during Intensive Phase CDT to limit limb swelling, reduce infection risk and limit LE progression.    Baseline  Max A    Time  12    Period  Weeks    Status  New    Target Date  01/23/20      OT LONG TERM GOAL #5   Title  Once issued Pt will be able to don and doff appropriate compression  garments and/ or devices using correct techniques and assistive devices with modified independence and extra time  for optimal LE management to limit progression over time.    Baseline  Max A    Time  12    Period  Weeks    Status  New    Target Date  01/23/20      Long  Term Additional Goals   Additional Long Term Goals  Yes      OT LONG TERM GOAL #6   Title  During self-management phase of CDT Pt will retain limb volume reductions achieved during Intensive Phase CDT with no more than 3% volume increase to limit LE progression and further functional decline.    Baseline  Max A    Time  6    Period  Months    Status  New    Target Date  04/22/20            Plan - 11/09/19 0916    Clinical Impression Statement  MLD to RLE/RLQ using short neck sequence, deep abdominal pathways, functional inguinals  and leg strokes. Pt tolerated without difficulty.Wrap RLE using gradient compression techniques to the knee with 8, 10, and 12 cm short stretch bandages over single layer of 0.4 cm Rosidal foam and stockinett.       Patient will benefit from skilled therapeutic intervention in order to improve the following deficits and impairments:           Visit Diagnosis: Lymphedema, not elsewhere classified    Problem List Patient Active Problem List   Diagnosis Date Noted  . Chronic GERD   . Colon cancer screening   . Migraine headache 02/17/2017  . Swelling of limb 06/28/2016  . Lymphedema 06/28/2016  . Pain in limb 06/28/2016  . Hyperglycemia 02/03/2015  . DVT of lower limb, acute ([place]) 01/08/2014  . High risk medication use 01/08/2014  . Hyperlipidemia 01/08/2014  . OSA (obstructive sleep apnea) 01/08/2014    Andrey Spearman, MS, OTR/L, [facility] 11/09/19 9:19 AM  [place] MAIN [facility] SERVICES [address] [place], [place], [address] Phone: [phone]   Fax:  [phone]  Name: FARZONA KROEKER MRN: [id number] Date of Birth:  1966/05/12

## 2019-11-14 ENCOUNTER — Ambulatory Visit: Payer: BC Managed Care – PPO | Admitting: Occupational Therapy

## 2019-11-14 ENCOUNTER — Other Ambulatory Visit: Payer: Self-pay

## 2019-11-14 DIAGNOSIS — I890 Lymphedema, not elsewhere classified: Secondary | ICD-10-CM | POA: Diagnosis not present

## 2019-11-14 NOTE — Therapy (Signed)
[place] MAIN [facility] SERVICES [address] [place], [place], [address] Phone: [phone]   Fax:  [phone]  Occupational Therapy Treatment  Patient Details  Name: Sharon Barnes MRN: [id number] Date of Birth: 24-Dec-1965 Referring Provider (OT): Eulogio Ditch, NP   Encounter Date: 11/14/2019  OT End of Session - 11/14/19 1613    Visit Number  5    Number of Visits  36    Date for OT Re-Evaluation  01/23/20    OT Start Time  0305    OT Stop Time  0415    OT Time Calculation (min)  70 min    Activity Tolerance  Patient tolerated treatment well;No increased pain    Behavior During Therapy  WFL for tasks assessed/performed       Past Medical History:  Diagnosis Date  . Arthritis    knees  . DVT (deep venous thrombosis) ([place]) 07/15/2014   Right leg  . GERD (gastroesophageal reflux disease)   . Hyperlipidemia   . Lymphedema   . Motion sickness    circular motion  . Numbness of feet   . PONV (postoperative nausea and vomiting)    after hysterectomy  . Sleep apnea    CPAP    Past Surgical History:  Procedure Laterality Date  . ABDOMINAL HYSTERECTOMY    . CHOLECYSTECTOMY    . COLONOSCOPY WITH PROPOFOL N/A 10/05/2019   Procedure: COLONOSCOPY WITH PROPOFOL;  Surgeon: Lin Landsman, MD;  Location: [place];  Service: Endoscopy;  Laterality: N/A;  . ESOPHAGOGASTRODUODENOSCOPY (EGD) WITH PROPOFOL N/A 10/05/2019   Procedure: ESOPHAGOGASTRODUODENOSCOPY (EGD) WITH PROPOFOL;  Surgeon: Lin Landsman, MD;  Location: [place];  Service: Endoscopy;  Laterality: N/A;  . KNEE ARTHROSCOPY WITH MENISCAL REPAIR Right 07/24/2019   Procedure: KNEE ARTHROSCOPY WITH PARTIAL MENISECTOMY CHONDROPLASTY, PATELLOFEMORAL COMPARTMENT, Plica Removal;  Surgeon: Leim Fabry, MD;  Location: [place];  Service: Orthopedics;  Laterality: Right;  sleep apnea  . NASAL SINUS SURGERY    . POLYPECTOMY  10/05/2019   Procedure:  POLYPECTOMY;  Surgeon: Lin Landsman, MD;  Location: [place];  Service: Endoscopy;;    There were no vitals filed for this visit.                OT Treatments/Exercises (OP) - 11/14/19 0001      ADLs   ADL Education Given  Yes  (Pended)       Manual Therapy   Manual Therapy  Edema management;Manual Lymphatic Drainage (MLD);Compression Bandaging  (Pended)              OT Education - 11/14/19 1613    Education Details  Pt edu for simple self MLD and reviewed compression wrapping    Person(s) Educated  Patient    Methods  Explanation;Demonstration;Tactile cues;Verbal cues    Comprehension  Verbalized understanding;Returned demonstration;Need further instruction          OT Long Term Goals - 10/25/19 1605      OT LONG TERM GOAL #1   Title  Pt will be able to verbalize signs and symptoms of cellulitis infection and identify 4 common lymphedema precautions using printed resource for reference (modified independence) with Max assist from spouse to limit LE progression over time.    Baseline  Max A    Time  4    Period  Days    Status  New      OT LONG TERM GOAL #2   Title  to ensure optimal limb volume reduction, to limit infection risk and to limit LE progression.Pt able to apply short stretch, multi-layered compression wraps using correct gradient techniques with modified independence (extra time) after skilled training for lymphedema self care to achieve limb volume reduction and limit progression of lymphedema.    Baseline  dependent    Time  4    Period  Days    Status  New    Target Date  --   4th OT visit for leg; 20th OT visit for UE     OT LONG TERM GOAL #3   Title  Pt to achieve at least 10% RLE limb volume reduction in BLE below the knees, and 10%limb volume reduction in LUE during Intensive Phase CDT to improve safe functional mobility and ambulation, to improve functional performance of basic and instrumental ADLs, to limit  infection risk and chronic LE progression.    Baseline  Max A    Time  12    Period  Weeks    Status  New    Target Date  01/23/20      OT LONG TERM GOAL #4   Title  Pt will achieve and sustain at least 85%  compliance with daily LE self-care home program (skin care, lymphatic pumping therex, compression and simple self-MLD) during Intensive Phase CDT to limit limb swelling, reduce infection risk and limit LE progression.    Baseline  Max A    Time  12    Period  Weeks    Status  New    Target Date  01/23/20      OT LONG TERM GOAL #5   Title  Once issued Pt will be able to don and doff appropriate compression garments and/ or devices using correct techniques and assistive devices with modified independence and extra time  for optimal LE management to limit progression over time.    Baseline  Max A    Time  12    Period  Weeks    Status  New    Target Date  01/23/20      Long Term Additional Goals   Additional Long Term Goals  Yes      OT LONG TERM GOAL #6   Title  During self-management phase of CDT Pt will retain limb volume reductions achieved during Intensive Phase CDT with no more than 3% volume increase to limit LE progression and further functional decline.    Baseline  Max A    Time  6    Period  Months    Status  New    Target Date  04/22/20            Plan - 11/14/19 1614    Clinical Impression Statement  Pt has mastered gradient compression wrapping. Goal met. [place] MLD and skin care, then reapplied gradient wraps as established . Pt tolerated all aspects of manual therapy and fully engaged in Pt edu for LE self care. Minimal limb volume reduction noted to date. Cont as per POC.       Patient will benefit from skilled therapeutic intervention in order to improve the following deficits and impairments:           Visit Diagnosis: Lymphedema, not elsewhere classified    Problem List Patient Active Problem List   Diagnosis Date Noted  . Chronic  GERD   . Colon cancer screening   . Migraine headache 02/17/2017  . Swelling of limb 06/28/2016  . Lymphedema 06/28/2016  .  Pain in limb 06/28/2016  . Hyperglycemia 02/03/2015  . DVT of lower limb, acute ([place]) 01/08/2014  . High risk medication use 01/08/2014  . Hyperlipidemia 01/08/2014  . OSA (obstructive sleep apnea) 01/08/2014    Andrey Spearman, MS, OTR/L, [facility] 11/14/19 4:17 PM  [place] MAIN [facility] SERVICES [address] [place], [place], [address] Phone: [phone]   Fax:  [phone]  Name: ARIBELLA VAVRA MRN: [id number] Date of Birth: [birth year]

## 2019-11-16 ENCOUNTER — Other Ambulatory Visit: Payer: Self-pay

## 2019-11-16 ENCOUNTER — Ambulatory Visit: Payer: BC Managed Care – PPO | Admitting: Occupational Therapy

## 2019-11-16 DIAGNOSIS — I890 Lymphedema, not elsewhere classified: Secondary | ICD-10-CM

## 2019-11-16 NOTE — Therapy (Signed)
[place] MAIN [facility] SERVICES [address] [place], [place], [address] Phone: [phone]   Fax:  [phone]  Occupational Therapy Treatment  Patient Details  Name: Sharon Barnes MRN: [id number] Date of Birth: 1965/10/04 Referring Provider (OT): Eulogio Ditch, NP   Encounter Date: 11/16/2019  OT End of Session - 11/16/19 0914    Visit Number  6    Number of Visits  36    Date for OT Re-Evaluation  01/23/20    OT Start Time  0804    OT Stop Time  0904    OT Time Calculation (min)  60 min    Activity Tolerance  Patient tolerated treatment well;No increased pain    Behavior During Therapy  WFL for tasks assessed/performed       Past Medical History:  Diagnosis Date  . Arthritis    knees  . DVT (deep venous thrombosis) ([place]) 07/15/2014   Right leg  . GERD (gastroesophageal reflux disease)   . Hyperlipidemia   . Lymphedema   . Motion sickness    circular motion  . Numbness of feet   . PONV (postoperative nausea and vomiting)    after hysterectomy  . Sleep apnea    CPAP    Past Surgical History:  Procedure Laterality Date  . ABDOMINAL HYSTERECTOMY    . CHOLECYSTECTOMY    . COLONOSCOPY WITH PROPOFOL N/A 10/05/2019   Procedure: COLONOSCOPY WITH PROPOFOL;  Surgeon: Lin Landsman, MD;  Location: [place];  Service: Endoscopy;  Laterality: N/A;  . ESOPHAGOGASTRODUODENOSCOPY (EGD) WITH PROPOFOL N/A 10/05/2019   Procedure: ESOPHAGOGASTRODUODENOSCOPY (EGD) WITH PROPOFOL;  Surgeon: Lin Landsman, MD;  Location: [place];  Service: Endoscopy;  Laterality: N/A;  . KNEE ARTHROSCOPY WITH MENISCAL REPAIR Right 07/24/2019   Procedure: KNEE ARTHROSCOPY WITH PARTIAL MENISECTOMY CHONDROPLASTY, PATELLOFEMORAL COMPARTMENT, Plica Removal;  Surgeon: Leim Fabry, MD;  Location: [place];  Service: Orthopedics;  Laterality: Right;  sleep apnea  . NASAL SINUS SURGERY    . POLYPECTOMY  10/05/2019   Procedure:  POLYPECTOMY;  Surgeon: Lin Landsman, MD;  Location: [place];  Service: Endoscopy;;    There were no vitals filed for this visit.  Subjective Assessment - 11/16/19 0911    Subjective   Sharon Barnes presents for OT Rxv visit 5/ 36 to address BLE lymphedema. Pt has no new complaints. She denies leg pain this morning.    Pertinent History  LE relevant PMH: OSA, cpap nightly. has not been calibrated in > 2 yrs. R meniscus tear and repair. obesity,    Limitations  impaired functional ambulation and mobility;  impaired standing balance;  decreased LB muscle strength, decreased LE AROM;   chronic, progressive BLE and L arm swelling and associated pain; limited with basic and instrumental ADLs requiring standing and walking > 15 mintes; difficulty fitting street shoes and LB clothing and UB clothing, difficulty lifting, reaching, carrying to perform self care, home management and productive/ work related activities    Repetition  Increases Symptoms    Special Tests  + stemer BLE and L hand    Pain Onset  --   pain onset estimated feb 2017                  OT Treatments/Exercises (OP) - 11/16/19 0001      ADLs   ADL Education Given  Yes      Manual Therapy   Manual Therapy  Edema management;Compression Bandaging;Manual Lymphatic Drainage (  MLD)    Manual Lymphatic Drainage (MLD)  MLD to RLE/RLQ using short neck sequence, deep abdominal pathways, functional inguinals  and leg strokes. Pt tolerated without difficulty.    Compression Bandaging  Wrap RLE using gradient compression techniques to the knee with 8, 10, and 12 cm short stretch bandages over single layer of 0.4 cm Rosidal foam and stockinett.             OT Education - 11/16/19 0912    Education Details  Pt edu throughout session for LE self care protocols includng simple self MLD and quick compression wrap review. Pt edu for lymphatic structure and function related to therapeutic modalities performed  today. Good return. Handouts given    Person(s) Educated  Patient    Methods  Explanation;Demonstration;Tactile cues;Verbal cues    Comprehension  Verbalized understanding;Returned demonstration;Need further instruction          OT Long Term Goals - 11/16/19 0900      OT LONG TERM GOAL #1   Title  Pt will be able to verbalize signs and symptoms of cellulitis infection and identify 4 common lymphedema precautions using printed resource for reference (modified independence) with Max assist from spouse to limit LE progression over time.    Baseline  Max A    Time  4    Period  Days    Status  Partially Met      OT LONG TERM GOAL #2   Title  to ensure optimal limb volume reduction, to limit infection risk and to limit LE progression.Pt able to apply short stretch, multi-layered compression wraps using correct gradient techniques with modified independence (extra time) after skilled training for lymphedema self care to achieve limb volume reduction and limit progression of lymphedema.    Baseline  dependent    Time  4    Period  Days    Status  Achieved    Target Date  --   met 11/16/18     OT LONG TERM GOAL #3   Title  Pt to achieve at least 10% RLE limb volume reduction in BLE below the knees, and 10%limb volume reduction in LUE during Intensive Phase CDT to improve safe functional mobility and ambulation, to improve functional performance of basic and instrumental ADLs, to limit infection risk and chronic LE progression.    Baseline  Max A    Time  12    Period  Weeks    Status  Partially Met      OT LONG TERM GOAL #4   Title  Pt will achieve and sustain at least 85%  compliance with daily LE self-care home program (skin care, lymphatic pumping therex, compression and simple self-MLD) during Intensive Phase CDT to limit limb swelling, reduce infection risk and limit LE progression.    Baseline  Max A    Time  12    Period  Weeks    Status  Partially Met      OT LONG TERM GOAL  #5   Title  Once issued Pt will be able to don and doff appropriate compression garments and/ or devices using correct techniques and assistive devices with modified independence and extra time  for optimal LE management to limit progression over time.    Baseline  Max A    Time  12    Period  Weeks    Status  On-going      OT LONG TERM GOAL #6   Title  During self-management phase  of CDT Pt will retain limb volume reductions achieved during Intensive Phase CDT with no more than 3% volume increase to limit LE progression and further functional decline.    Baseline  Max A    Time  6    Period  Months    Status  On-going            Plan - 11/16/19 0914    Clinical Impression Statement  After skilled teaching Pt able to perform J Stroke and short neck sequence correctly. Pt has mastered gradient compression wrap using 3 laters. Pt tolerated MLD without increased pain. Slight change in calf reduction noted today. Cont as per OC.       Patient will benefit from skilled therapeutic intervention in order to improve the following deficits and impairments:           Visit Diagnosis: Lymphedema, not elsewhere classified    Problem List Patient Active Problem List   Diagnosis Date Noted  . Chronic GERD   . Colon cancer screening   . Migraine headache 02/17/2017  . Swelling of limb 06/28/2016  . Lymphedema 06/28/2016  . Pain in limb 06/28/2016  . Hyperglycemia 02/03/2015  . DVT of lower limb, acute ([place]) 01/08/2014  . High risk medication use 01/08/2014  . Hyperlipidemia 01/08/2014  . OSA (obstructive sleep apnea) 01/08/2014    Andrey Spearman, MS, OTR/L, [facility] 11/16/19 9:18 AM   [place] MAIN [facility] SERVICES [address] [place], [place], [address] Phone: [phone]   Fax:  [phone]  Name: Sharon Barnes MRN: [id number] Date of Birth: August 19, 1966

## 2019-11-23 ENCOUNTER — Ambulatory Visit: Payer: BC Managed Care – PPO | Attending: Nurse Practitioner | Admitting: Occupational Therapy

## 2019-11-23 ENCOUNTER — Other Ambulatory Visit: Payer: Self-pay

## 2019-11-23 DIAGNOSIS — I890 Lymphedema, not elsewhere classified: Secondary | ICD-10-CM | POA: Diagnosis present

## 2019-11-23 NOTE — Therapy (Signed)
[place] MAIN [facility] SERVICES [address] [place], [place], [address] Phone: [phone]   Fax:  [phone]  Occupational Therapy Treatment  Patient Details  Name: Sharon Barnes MRN: [id number] Date of Birth: Mar 19, 1966 Referring Provider (OT): Eulogio Ditch, NP   Encounter Date: 11/23/2019  OT End of Session - 11/23/19 1021    Visit Number  6    Number of Visits  36    Date for OT Re-Evaluation  01/23/20    OT Start Time  0809    OT Stop Time  0903    OT Time Calculation (min)  54 min    Activity Tolerance  Patient tolerated treatment well;No increased pain    Behavior During Therapy  WFL for tasks assessed/performed       Past Medical History:  Diagnosis Date  . Arthritis    knees  . DVT (deep venous thrombosis) ([place]) 07/15/2014   Right leg  . GERD (gastroesophageal reflux disease)   . Hyperlipidemia   . Lymphedema   . Motion sickness    circular motion  . Numbness of feet   . PONV (postoperative nausea and vomiting)    after hysterectomy  . Sleep apnea    CPAP    Past Surgical History:  Procedure Laterality Date  . ABDOMINAL HYSTERECTOMY    . CHOLECYSTECTOMY    . COLONOSCOPY WITH PROPOFOL N/A 10/05/2019   Procedure: COLONOSCOPY WITH PROPOFOL;  Surgeon: Lin Landsman, MD;  Location: [place];  Service: Endoscopy;  Laterality: N/A;  . ESOPHAGOGASTRODUODENOSCOPY (EGD) WITH PROPOFOL N/A 10/05/2019   Procedure: ESOPHAGOGASTRODUODENOSCOPY (EGD) WITH PROPOFOL;  Surgeon: Lin Landsman, MD;  Location: [place];  Service: Endoscopy;  Laterality: N/A;  . KNEE ARTHROSCOPY WITH MENISCAL REPAIR Right 07/24/2019   Procedure: KNEE ARTHROSCOPY WITH PARTIAL MENISECTOMY CHONDROPLASTY, PATELLOFEMORAL COMPARTMENT, Plica Removal;  Surgeon: Leim Fabry, MD;  Location: [place];  Service: Orthopedics;  Laterality: Right;  sleep apnea  . NASAL SINUS SURGERY    . POLYPECTOMY  10/05/2019   Procedure:  POLYPECTOMY;  Surgeon: Lin Landsman, MD;  Location: [place];  Service: Endoscopy;;    There were no vitals filed for this visit.  Subjective Assessment - 11/23/19 1017    Subjective   Sharon Barnes presents for OT Rxv visit 6/ 36 to address BLE lymphedema. Pt reports increasing tolerance for LE comrpession wraps. Pt reports increased L hand swelling this morning upon waking.    Pertinent History  LE relevant PMH: OSA, cpap nightly. has not been calibrated in > 2 yrs. R meniscus tear and repair. obesity,    Limitations  impaired functional ambulation and mobility;  impaired standing balance;  decreased LB muscle strength, decreased LE AROM;   chronic, progressive BLE and L arm swelling and associated pain; limited with basic and instrumental ADLs requiring standing and walking > 15 mintes; difficulty fitting street shoes and LB clothing and UB clothing, difficulty lifting, reaching, carrying to perform self care, home management and productive/ work related activities    Repetition  Increases Symptoms    Special Tests  + stemer BLE and L hand    Currently in Pain?  Yes    Pain Onset  --   pain onset estimated feb 2017                  OT Treatments/Exercises (OP) - 11/23/19 0001      ADLs   ADL Education Given  Yes  Manual Therapy   Manual Therapy  Edema management;Compression Bandaging;Manual Lymphatic Drainage (MLD)    Manual Lymphatic Drainage (MLD)  MLD to LUE/LUQ today utilizing short neck and functional axillary LN. Pt tolerated manual therapy without pain. No change observed at end of session.    Compression Bandaging  Wrap RLE using gradient compression techniques to the knee with 8, 10, and 12 cm short stretch bandages over single layer of 0.4 cm Rosidal foam and stockinett.             OT Education - 11/23/19 1020    Education Details  Continued skilled Pt/caregiver education  And LE ADL training throughout visit for lymphedema self  care/ home program, including compression wrapping, compression garment and device wear/care, lymphatic pumping ther ex, simple self-MLD, and skin care. Discussed progress towards goals.    Person(s) Educated  Patient    Methods  Explanation;Demonstration    Comprehension  Verbalized understanding;Returned demonstration;Need further instruction          OT Long Term Goals - 11/16/19 0900      OT LONG TERM GOAL #1   Title  Pt will be able to verbalize signs and symptoms of cellulitis infection and identify 4 common lymphedema precautions using printed resource for reference (modified independence) with Max assist from spouse to limit LE progression over time.    Baseline  Max A    Time  4    Period  Days    Status  Partially Met      OT LONG TERM GOAL #2   Title  to ensure optimal limb volume reduction, to limit infection risk and to limit LE progression.Pt able to apply short stretch, multi-layered compression wraps using correct gradient techniques with modified independence (extra time) after skilled training for lymphedema self care to achieve limb volume reduction and limit progression of lymphedema.    Baseline  dependent    Time  4    Period  Days    Status  Achieved    Target Date  --   met 11/16/18     OT LONG TERM GOAL #3   Title  Pt to achieve at least 10% RLE limb volume reduction in BLE below the knees, and 10%limb volume reduction in LUE during Intensive Phase CDT to improve safe functional mobility and ambulation, to improve functional performance of basic and instrumental ADLs, to limit infection risk and chronic LE progression.    Baseline  Max A    Time  12    Period  Weeks    Status  Partially Met      OT LONG TERM GOAL #4   Title  Pt will achieve and sustain at least 85%  compliance with daily LE self-care home program (skin care, lymphatic pumping therex, compression and simple self-MLD) during Intensive Phase CDT to limit limb swelling, reduce infection risk  and limit LE progression.    Baseline  Max A    Time  12    Period  Weeks    Status  Partially Met      OT LONG TERM GOAL #5   Title  Once issued Pt will be able to don and doff appropriate compression garments and/ or devices using correct techniques and assistive devices with modified independence and extra time  for optimal LE management to limit progression over time.    Baseline  Max A    Time  12    Period  Weeks    Status  On-going  OT LONG TERM GOAL #6   Title  During self-management phase of CDT Pt will retain limb volume reductions achieved during Intensive Phase CDT with no more than 3% volume increase to limit LE progression and further functional decline.    Baseline  Max A    Time  6    Period  Months    Status  On-going            Plan - 11/23/19 1023    Clinical Impression Statement  MLD to [place] today utilizing short neck and functional axillary LN. Issued L, light duty, small compression glove with open finger tips. Good fit. Pt tolerated manual therapy without pain. No change observed at end of session.       Patient will benefit from skilled therapeutic intervention in order to improve the following deficits and impairments:           Visit Diagnosis: Lymphedema, not elsewhere classified    Problem List Patient Active Problem List   Diagnosis Date Noted  . Chronic GERD   . Colon cancer screening   . Migraine headache 02/17/2017  . Swelling of limb 06/28/2016  . Lymphedema 06/28/2016  . Pain in limb 06/28/2016  . Hyperglycemia 02/03/2015  . DVT of lower limb, acute ([place]) 01/08/2014  . High risk medication use 01/08/2014  . Hyperlipidemia 01/08/2014  . OSA (obstructive sleep apnea) 01/08/2014    Andrey Spearman, MS, OTR/L, [facility] 11/23/19 10:25 AM   [place] MAIN [facility] SERVICES [address] [place], [place], [address] Phone: [phone]   Fax:  [phone]  Name: Sharon Barnes MRN: [id number] Date of Birth: 1966/04/23

## 2019-11-27 ENCOUNTER — Ambulatory Visit: Payer: BC Managed Care – PPO | Admitting: Occupational Therapy

## 2019-11-27 ENCOUNTER — Other Ambulatory Visit: Payer: Self-pay

## 2019-11-27 DIAGNOSIS — I890 Lymphedema, not elsewhere classified: Secondary | ICD-10-CM | POA: Diagnosis not present

## 2019-11-27 NOTE — Therapy (Signed)
[place] MAIN [facility] SERVICES [address] [place], [place], [address] Phone: [phone]   Fax:  [phone]  Occupational Therapy Treatment  Patient Details  Name: Sharon Barnes MRN: [id number] Date of Birth: 1965/11/22 Referring Provider (OT): Eulogio Ditch, NP   Encounter Date: 11/27/2019  OT End of Session - 11/27/19 0901    Visit Number  7    Number of Visits  36    Date for OT Re-Evaluation  01/23/20    OT Start Time  0814    OT Stop Time  0900    OT Time Calculation (min)  46 min    Activity Tolerance  Patient tolerated treatment well;No increased pain    Behavior During Therapy  WFL for tasks assessed/performed       Past Medical History:  Diagnosis Date  . Arthritis    knees  . DVT (deep venous thrombosis) ([place]) 07/15/2014   Right leg  . GERD (gastroesophageal reflux disease)   . Hyperlipidemia   . Lymphedema   . Motion sickness    circular motion  . Numbness of feet   . PONV (postoperative nausea and vomiting)    after hysterectomy  . Sleep apnea    CPAP    Past Surgical History:  Procedure Laterality Date  . ABDOMINAL HYSTERECTOMY    . CHOLECYSTECTOMY    . COLONOSCOPY WITH PROPOFOL N/A 10/05/2019   Procedure: COLONOSCOPY WITH PROPOFOL;  Surgeon: Lin Landsman, MD;  Location: [place];  Service: Endoscopy;  Laterality: N/A;  . ESOPHAGOGASTRODUODENOSCOPY (EGD) WITH PROPOFOL N/A 10/05/2019   Procedure: ESOPHAGOGASTRODUODENOSCOPY (EGD) WITH PROPOFOL;  Surgeon: Lin Landsman, MD;  Location: [place];  Service: Endoscopy;  Laterality: N/A;  . KNEE ARTHROSCOPY WITH MENISCAL REPAIR Right 07/24/2019   Procedure: KNEE ARTHROSCOPY WITH PARTIAL MENISECTOMY CHONDROPLASTY, PATELLOFEMORAL COMPARTMENT, Plica Removal;  Surgeon: Leim Fabry, MD;  Location: [place];  Service: Orthopedics;  Laterality: Right;  sleep apnea  . NASAL SINUS SURGERY    . POLYPECTOMY  10/05/2019   Procedure:  POLYPECTOMY;  Surgeon: Lin Landsman, MD;  Location: [place];  Service: Endoscopy;;    There were no vitals filed for this visit.  Subjective Assessment - 11/27/19 0818    Subjective   Sharon Barnes presents for OT Rxv visit 7/ 36 to address BLE lymphedema. Pt 14 minutes late for 60 minute appointment. 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    Pertinent History  LE relevant PMH: OSA, cpap nightly. has not been calibrated in > 2 yrs. R meniscus tear and repair. obesity,    Limitations  impaired functional ambulation and mobility;  impaired standing balance;  decreased LB muscle strength, decreased LE AROM;   chronic, progressive BLE and L arm swelling and associated pain; limited with basic and instrumental ADLs requiring standing and walking > 15 mintes; difficulty fitting street shoes and LB clothing and UB clothing, difficulty lifting, reaching, carrying to perform self care, home management and productive/ work related activities    Repetition  Increases Symptoms    Special Tests  + stemer BLE and L hand    Pain Onset  --   pain onset estimated feb 2017                  OT Treatments/Exercises (OP) - 11/27/19 0001      ADLs   ADL Education Given  Yes  Manual Therapy   Manual Therapy  Edema management    Manual Lymphatic Drainage (MLD)  MLD to LUE/LUQ today utilizing short neck and functional axillary LN. Pt tolerated manual therapy without pain. No change observed at end of session.    Compression Bandaging  Wrap RLE using gradient compression techniques to the knee with 8, 10, and 12 cm short stretch bandages over single layer of 0.4 cm Rosidal foam and stockinett.             OT Education - 11/27/19 0901    Education Details  Continued skilled Pt/caregiver education  And LE ADL training throughout visit for lymphedema self care/ home program, including compression wrapping,  compression garment and device wear/care, lymphatic pumping ther ex, simple self-MLD, and skin care. Discussed progress towards goals.    Person(s) Educated  Patient    Methods  Explanation;Demonstration    Comprehension  Verbalized understanding;Returned demonstration;Need further instruction          OT Long Term Goals - 11/16/19 0900      OT LONG TERM GOAL #1   Title  Pt will be able to verbalize signs and symptoms of cellulitis infection and identify 4 common lymphedema precautions using printed resource for reference (modified independence) with Max assist from spouse to limit LE progression over time.    Baseline  Max A    Time  4    Period  Days    Status  Partially Met      OT LONG TERM GOAL #2   Title  to ensure optimal limb volume reduction, to limit infection risk and to limit LE progression.Pt able to apply short stretch, multi-layered compression wraps using correct gradient techniques with modified independence (extra time) after skilled training for lymphedema self care to achieve limb volume reduction and limit progression of lymphedema.    Baseline  dependent    Time  4    Period  Days    Status  Achieved    Target Date  --   met 11/16/18     OT LONG TERM GOAL #3   Title  Pt to achieve at least 10% RLE limb volume reduction in BLE below the knees, and 10%limb volume reduction in LUE during Intensive Phase CDT to improve safe functional mobility and ambulation, to improve functional performance of basic and instrumental ADLs, to limit infection risk and chronic LE progression.    Baseline  Max A    Time  12    Period  Weeks    Status  Partially Met      OT LONG TERM GOAL #4   Title  Pt will achieve and sustain at least 85%  compliance with daily LE self-care home program (skin care, lymphatic pumping therex, compression and simple self-MLD) during Intensive Phase CDT to limit limb swelling, reduce infection risk and limit LE progression.    Baseline  Max A    Time   12    Period  Weeks    Status  Partially Met      OT LONG TERM GOAL #5   Title  Once issued Pt will be able to don and doff appropriate compression garments and/ or devices using correct techniques and assistive devices with modified independence and extra time  for optimal LE management to limit progression over time.    Baseline  Max A    Time  12    Period  Weeks    Status  On-going  OT LONG TERM GOAL #6   Title  During self-management phase of CDT Pt will retain limb volume reductions achieved during Intensive Phase CDT with no more than 3% volume increase to limit LE progression and further functional decline.    Baseline  Max A    Time  6    Period  Months    Status  On-going            Plan - 11/27/19 0901    Clinical Impression Statement  RLE swelling well managed this morning. Pt has mastered compressin wrapping using gradient techniques. Skin is in good condition , pliable and well hydrated. Knee area remains warm to palpation. Pt tolerat8ihng all aspects of CDT and is >85% compliant with home program. Cont as per POC.       Patient will benefit from skilled therapeutic intervention in order to improve the following deficits and impairments:           Visit Diagnosis: Lymphedema, not elsewhere classified    Problem List Patient Active Problem List   Diagnosis Date Noted  . Chronic GERD   . Colon cancer screening   . Migraine headache 02/17/2017  . Swelling of limb 06/28/2016  . Lymphedema 06/28/2016  . Pain in limb 06/28/2016  . Hyperglycemia 02/03/2015  . DVT of lower limb, acute ([place]) 01/08/2014  . High risk medication use 01/08/2014  . Hyperlipidemia 01/08/2014  . OSA (obstructive sleep apnea) 01/08/2014    Andrey Spearman, MS, OTR/L, [facility] 11/27/19 9:04 AM  [place] MAIN [facility] SERVICES [address] [place], [place], [address] Phone: [phone]   Fax:  [phone]  Name: Sharon Barnes MRN: [id number] Date of Birth: 1966/06/07

## 2019-11-30 ENCOUNTER — Ambulatory Visit: Payer: BC Managed Care – PPO | Admitting: Occupational Therapy

## 2019-11-30 ENCOUNTER — Other Ambulatory Visit: Payer: Self-pay

## 2019-11-30 DIAGNOSIS — I890 Lymphedema, not elsewhere classified: Secondary | ICD-10-CM | POA: Diagnosis not present

## 2019-11-30 NOTE — Therapy (Signed)
[place] MAIN [facility] SERVICES [address] [place], [place], [address] Phone: [phone]   Fax:  [phone]  Occupational Therapy Treatment  Patient Details  Name: Sharon Barnes MRN: [id number] Date of Birth: Jan 03, 1966 Referring Provider (OT): Eulogio Ditch, NP   Encounter Date: 11/30/2019  OT End of Session - 11/30/19 0908    Visit Number  8    Number of Visits  36    Date for OT Re-Evaluation  01/23/20    OT Start Time  0802    OT Stop Time  0905    OT Time Calculation (min)  63 min    Activity Tolerance  Patient tolerated treatment well;No increased pain    Behavior During Therapy  WFL for tasks assessed/performed       Past Medical History:  Diagnosis Date  . Arthritis    knees  . DVT (deep venous thrombosis) ([place]) 07/15/2014   Right leg  . GERD (gastroesophageal reflux disease)   . Hyperlipidemia   . Lymphedema   . Motion sickness    circular motion  . Numbness of feet   . PONV (postoperative nausea and vomiting)    after hysterectomy  . Sleep apnea    CPAP    Past Surgical History:  Procedure Laterality Date  . ABDOMINAL HYSTERECTOMY    . CHOLECYSTECTOMY    . COLONOSCOPY WITH PROPOFOL N/A 10/05/2019   Procedure: COLONOSCOPY WITH PROPOFOL;  Surgeon: Lin Landsman, MD;  Location: [place];  Service: Endoscopy;  Laterality: N/A;  . ESOPHAGOGASTRODUODENOSCOPY (EGD) WITH PROPOFOL N/A 10/05/2019   Procedure: ESOPHAGOGASTRODUODENOSCOPY (EGD) WITH PROPOFOL;  Surgeon: Lin Landsman, MD;  Location: [place];  Service: Endoscopy;  Laterality: N/A;  . KNEE ARTHROSCOPY WITH MENISCAL REPAIR Right 07/24/2019   Procedure: KNEE ARTHROSCOPY WITH PARTIAL MENISECTOMY CHONDROPLASTY, PATELLOFEMORAL COMPARTMENT, Plica Removal;  Surgeon: Leim Fabry, MD;  Location: [place];  Service: Orthopedics;  Laterality: Right;  sleep apnea  . NASAL SINUS SURGERY    . POLYPECTOMY  10/05/2019   Procedure:  POLYPECTOMY;  Surgeon: Lin Landsman, MD;  Location: [place];  Service: Endoscopy;;    There were no vitals filed for this visit.  Subjective Assessment - 11/30/19 0321    Subjective   Sharon Barnes presents for OT Rxv visit 8/ 36 to address BLE lymphedema. Pt 14 minutes late for 60 minute appointment. 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    Pertinent History  LE relevant PMH: OSA, cpap nightly. has not been calibrated in > 2 yrs. R meniscus tear and repair. obesity,    Limitations  impaired functional ambulation and mobility;  impaired standing balance;  decreased LB muscle strength, decreased LE AROM;   chronic, progressive BLE and L arm swelling and associated pain; limited with basic and instrumental ADLs requiring standing and walking > 15 mintes; difficulty fitting street shoes and LB clothing and UB clothing, difficulty lifting, reaching, carrying to perform self care, home management and productive/ work related activities    Repetition  Increases Symptoms    Special Tests  + stemer BLE and L hand    Currently in Pain?  Yes    Pain Score  2     Pain Location  Knee    Pain Orientation  Right    Pain Type  Chronic pain    Pain Onset  --   pain onset estimated feb 2017  OT Treatments/Exercises (OP) - 11/30/19 0001      ADLs   ADL Education Given  Yes  (Pended)       Manual Therapy   Manual Therapy  Edema management;Manual Lymphatic Drainage (MLD);Compression Bandaging  (Pended)              OT Education - 11/30/19 0908    Education Details  Continued skilled Pt/caregiver education  And LE ADL training throughout visit for lymphedema self care/ home program, including compression wrapping, compression garment and device wear/care, lymphatic pumping ther ex, simple self-MLD, and skin care. Discussed progress towards goals.    Person(s) Educated  Patient     Methods  Explanation;Demonstration    Comprehension  Verbalized understanding;Returned demonstration;Need further instruction          OT Long Term Goals - 11/16/19 0900      OT LONG TERM GOAL #1   Title  Pt will be able to verbalize signs and symptoms of cellulitis infection and identify 4 common lymphedema precautions using printed resource for reference (modified independence) with Max assist from spouse to limit LE progression over time.    Baseline  Max A    Time  4    Period  Days    Status  Partially Met      OT LONG TERM GOAL #2   Title  to ensure optimal limb volume reduction, to limit infection risk and to limit LE progression.Pt able to apply short stretch, multi-layered compression wraps using correct gradient techniques with modified independence (extra time) after skilled training for lymphedema self care to achieve limb volume reduction and limit progression of lymphedema.    Baseline  dependent    Time  4    Period  Days    Status  Achieved    Target Date  --   met 11/16/18     OT LONG TERM GOAL #3   Title  Pt to achieve at least 10% RLE limb volume reduction in BLE below the knees, and 10%limb volume reduction in LUE during Intensive Phase CDT to improve safe functional mobility and ambulation, to improve functional performance of basic and instrumental ADLs, to limit infection risk and chronic LE progression.    Baseline  Max A    Time  12    Period  Weeks    Status  Partially Met      OT LONG TERM GOAL #4   Title  Pt will achieve and sustain at least 85%  compliance with daily LE self-care home program (skin care, lymphatic pumping therex, compression and simple self-MLD) during Intensive Phase CDT to limit limb swelling, reduce infection risk and limit LE progression.    Baseline  Max A    Time  12    Period  Weeks    Status  Partially Met      OT LONG TERM GOAL #5   Title  Once issued Pt will be able to don and doff appropriate compression garments and/ or  devices using correct techniques and assistive devices with modified independence and extra time  for optimal LE management to limit progression over time.    Baseline  Max A    Time  12    Period  Weeks    Status  On-going      OT LONG TERM GOAL #6   Title  During self-management phase of CDT Pt will retain limb volume reductions achieved during Intensive Phase CDT with no more than 3% volume  increase to limit LE progression and further functional decline.    Baseline  Max A    Time  6    Period  Months    Status  On-going            Plan - 11/30/19 0909    Clinical Impression Statement  MLD to LUE/LUQ today utilizing short neck and functional axillary LN. Issued L, light duty, small compression glove with open finger tips. Good fit. Pt tolerated manual therapy without pain. No change observed at end of session.       Patient will benefit from skilled therapeutic intervention in order to improve the following deficits and impairments:           Visit Diagnosis: Lymphedema, not elsewhere classified    Problem List Patient Active Problem List   Diagnosis Date Noted  . Chronic GERD   . Colon cancer screening   . Migraine headache 02/17/2017  . Swelling of limb 06/28/2016  . Lymphedema 06/28/2016  . Pain in limb 06/28/2016  . Hyperglycemia 02/03/2015  . DVT of lower limb, acute ([place]) 01/08/2014  . High risk medication use 01/08/2014  . Hyperlipidemia 01/08/2014  . OSA (obstructive sleep apnea) 01/08/2014    Andrey Spearman, MS, OTR/L, [facility] 11/30/19 9:10 AM  [place] MAIN [facility] SERVICES [address] [place], [place], [address] Phone: [phone]   Fax:  [phone]  Name: TONI DEMO MRN: [id number] Date of Birth: 06/18/1966

## 2019-12-04 ENCOUNTER — Ambulatory Visit: Payer: BC Managed Care – PPO | Admitting: Occupational Therapy

## 2019-12-06 ENCOUNTER — Other Ambulatory Visit: Payer: Self-pay

## 2019-12-06 ENCOUNTER — Ambulatory Visit: Payer: BC Managed Care – PPO | Admitting: Occupational Therapy

## 2019-12-06 DIAGNOSIS — I890 Lymphedema, not elsewhere classified: Secondary | ICD-10-CM

## 2019-12-06 NOTE — Therapy (Signed)
[place] MAIN [facility] SERVICES [address] [place], [place], [address] Phone: [phone]   Fax:  [phone]  Occupational Therapy Treatment  Patient Details  Name: Sharon Barnes MRN: [id number] Date of Birth: 05-31-1966 Referring Provider (OT): Eulogio Ditch, NP   Encounter Date: 12/06/2019  OT End of Session - 12/06/19 1236    Visit Number  9    Number of Visits  36    Date for OT Re-Evaluation  01/23/20    OT Start Time  0808    OT Stop Time  0850    OT Time Calculation (min)  42 min    Activity Tolerance  Patient tolerated treatment well;No increased pain    Behavior During Therapy  WFL for tasks assessed/performed       Past Medical History:  Diagnosis Date  . Arthritis    knees  . DVT (deep venous thrombosis) ([place]) 07/15/2014   Right leg  . GERD (gastroesophageal reflux disease)   . Hyperlipidemia   . Lymphedema   . Motion sickness    circular motion  . Numbness of feet   . PONV (postoperative nausea and vomiting)    after hysterectomy  . Sleep apnea    CPAP    Past Surgical History:  Procedure Laterality Date  . ABDOMINAL HYSTERECTOMY    . CHOLECYSTECTOMY    . COLONOSCOPY WITH PROPOFOL N/A 10/05/2019   Procedure: COLONOSCOPY WITH PROPOFOL;  Surgeon: Lin Landsman, MD;  Location: [place];  Service: Endoscopy;  Laterality: N/A;  . ESOPHAGOGASTRODUODENOSCOPY (EGD) WITH PROPOFOL N/A 10/05/2019   Procedure: ESOPHAGOGASTRODUODENOSCOPY (EGD) WITH PROPOFOL;  Surgeon: Lin Landsman, MD;  Location: [place];  Service: Endoscopy;  Laterality: N/A;  . KNEE ARTHROSCOPY WITH MENISCAL REPAIR Right 07/24/2019   Procedure: KNEE ARTHROSCOPY WITH PARTIAL MENISECTOMY CHONDROPLASTY, PATELLOFEMORAL COMPARTMENT, Plica Removal;  Surgeon: Leim Fabry, MD;  Location: [place];  Service: Orthopedics;  Laterality: Right;  sleep apnea  . NASAL SINUS SURGERY    . POLYPECTOMY  10/05/2019   Procedure:  POLYPECTOMY;  Surgeon: Lin Landsman, MD;  Location: [place];  Service: Endoscopy;;    There were no vitals filed for this visit.  Subjective Assessment - 12/06/19 5638    Subjective   Sharon Barnes presents for OT Rxv visit 9/ 36 to address BLE lymphedema. Pt is 9 minutes late for 60 minute appointment. Pt reports 3/10 pain in L knee without known precipitating event. Pt reports increased swelling in BUE, including hands over the weekend. Pt has not had her BP checked recently. She is not currently dx w/ HTN. Systemic fluid retention, along with OSA and hx of DVT,   may be contributing issue to global edema.    Pertinent History  LE relevant PMH: OSA, cpap nightly. has not been calibrated in > 2 yrs. R meniscus tear and repair. obesity,    Limitations  impaired functional ambulation and mobility;  impaired standing balance;  decreased LB muscle strength, decreased LE AROM;   chronic, progressive BLE and L arm swelling and associated pain; limited with basic and instrumental ADLs requiring standing and walking > 15 mintes; difficulty fitting street shoes and LB clothing and UB clothing, difficulty lifting, reaching, carrying to perform self care, home management and productive/ work related activities    Repetition  Increases Symptoms    Special Tests  + stemer BLE and L hand    Pain Onset  --   pain onset estimated feb  2017         LYMPHEDEMA/ONCOLOGY QUESTIONNAIRE - 12/06/19 1233      Right Lower Extremity Lymphedema   Other  RLE ankle to tibial tuberosity (A-D) limb volume = 4171.9 ml.     Other  RLE A-D limb volume is decreased by 8.49% since commencing CDT on 11/02/19.              OT Treatments/Exercises (OP) - 12/06/19 0001      ADLs   ADL Education Given  Yes      Manual Therapy   Manual Therapy  Edema management;Manual Lymphatic Drainage (MLD);Compression Bandaging    Edema Management  RLE comparative limb volumetrics    ankle to tibial tuberosity  (A-D)             OT Education - 12/06/19 1236    Education Details  Continued skilled Pt/caregiver education  And LE ADL training throughout visit for lymphedema self care/ home program, including compression wrapping, compression garment and device wear/care, lymphatic pumping ther ex, simple self-MLD, and skin care. Discussed progress towards goals.    Person(s) Educated  Patient    Methods  Explanation;Demonstration    Comprehension  Verbalized understanding;Returned demonstration;Need further instruction          OT Long Term Goals - 12/06/19 1200      OT LONG TERM GOAL #1   Title  Pt will be able to verbalize signs and symptoms of cellulitis infection and identify 4 common lymphedema precautions using printed resource for reference (modified independence) with Max assist from spouse to limit LE progression over time.    Baseline  Max A    Time  4    Period  Days    Status  Achieved      OT LONG TERM GOAL #2   Title  to ensure optimal limb volume reduction, to limit infection risk and to limit LE progression.Pt able to apply short stretch, multi-layered compression wraps using correct gradient techniques with modified independence (extra time) after skilled training for lymphedema self care to achieve limb volume reduction and limit progression of lymphedema.    Baseline  dependent    Time  4    Period  Days    Status  Achieved      OT LONG TERM GOAL #3   Title  Pt to achieve at least 10% RLE limb volume reduction in BLE below the knees, and 10%limb volume reduction in LUE during Intensive Phase CDT to improve safe functional mobility and ambulation, to improve functional performance of basic and instrumental ADLs, to limit infection risk and chronic LE progression.    Baseline  Max A    Time  12    Period  Weeks    Status  Partially Met    Target Date  --   RLE volume reduction below the knee measures 8.49% on 4/15     OT LONG TERM GOAL #4   Title  Pt will achieve  and sustain at least 85%  compliance with daily LE self-care home program (skin care, lymphatic pumping therex, compression and simple self-MLD) during Intensive Phase CDT to limit limb swelling, reduce infection risk and limit LE progression.    Baseline  Max A    Time  12    Period  Weeks    Status  Partially Met      OT LONG TERM GOAL #5   Title  Once issued Pt will be able to don and doff  appropriate compression garments and/ or devices using correct techniques and assistive devices with modified independence and extra time  for optimal LE management to limit progression over time.    Baseline  Max A    Time  12    Period  Weeks    Status  On-going      OT LONG TERM GOAL #6   Title  During self-management phase of CDT Pt will retain limb volume reductions achieved during Intensive Phase CDT with no more than 3% volume increase to limit LE progression and further functional decline.    Baseline  Max A    Time  6    Period  Months    Status  On-going            Plan - 12/06/19 1242    Clinical Impression Statement  RLE (Rx limb) comparative limb volumetrics reveal RLE from ankle to tibial tuberosity is decreased in volume by 8.49% since commencing CDT on 11/02/19. This value is approaching 10% goal. Pain in RLE is decreased by reports since commencing CDT. Pt is able to apply gradient compression wraps correctly and is compliant for the most part. She continues to have some difficulty elevating legs at work during the day, but when seated at home elevates as directed. She consistently uses basic, thigh length, 8 chambered (?)  sequential "pump" perscribed by referring vascular provider. Pt will see Dr Haynes Kerns next week to explore etiology of fluctuating swelling in all 4 limbs. Cont as per POC. Completecompression stocking measurements next visit- Jobst , flat knit, Elvarex SOFT, ccl 3 (34-46 mmHg) knee highs. Consider Jobst Elvarex PLUS seamless, ccl 2 toe caps.       Patient will  benefit from skilled therapeutic intervention in order to improve the following deficits and impairments:           Visit Diagnosis: Lymphedema, not elsewhere classified    Problem List Patient Active Problem List   Diagnosis Date Noted  . Chronic GERD   . Colon cancer screening   . Migraine headache 02/17/2017  . Swelling of limb 06/28/2016  . Lymphedema 06/28/2016  . Pain in limb 06/28/2016  . Hyperglycemia 02/03/2015  . DVT of lower limb, acute ([place]) 01/08/2014  . High risk medication use 01/08/2014  . Hyperlipidemia 01/08/2014  . OSA (obstructive sleep apnea) 01/08/2014    Andrey Spearman, MS, OTR/L, [facility] 12/06/19 12:53 PM  [place] MAIN [facility] SERVICES [address] [place], [place], [address] Phone: [phone]   Fax:  [phone]  Name: Sharon Barnes MRN: [id number] Date of Birth: 1965-12-28

## 2019-12-11 ENCOUNTER — Ambulatory Visit: Payer: BC Managed Care – PPO | Admitting: Occupational Therapy

## 2019-12-11 ENCOUNTER — Other Ambulatory Visit: Payer: Self-pay

## 2019-12-11 DIAGNOSIS — I890 Lymphedema, not elsewhere classified: Secondary | ICD-10-CM | POA: Diagnosis not present

## 2019-12-11 NOTE — Therapy (Signed)
[place] MAIN [facility] SERVICES [address] [place], [place], [address] Phone: [phone]   Fax:  [phone]  Occupational Therapy Treatment Note and Progress Report: Lymphedema Care  Patient Details  Name: Sharon Barnes MRN: [id number] Date of Birth: 13-Sep-1965 Referring Provider (OT): Eulogio Ditch, NP   Encounter Date: 12/11/2019  OT End of Session - 12/11/19 0854    Visit Number  10    Number of Visits  36    Date for OT Re-Evaluation  01/23/20    OT Start Time  0805    OT Stop Time  0850    OT Time Calculation (min)  45 min    Activity Tolerance  Patient tolerated treatment well;No increased pain    Behavior During Therapy  WFL for tasks assessed/performed       Past Medical History:  Diagnosis Date  . Arthritis    knees  . DVT (deep venous thrombosis) ([place]) 07/15/2014   Right leg  . GERD (gastroesophageal reflux disease)   . Hyperlipidemia   . Lymphedema   . Motion sickness    circular motion  . Numbness of feet   . PONV (postoperative nausea and vomiting)    after hysterectomy  . Sleep apnea    CPAP    Past Surgical History:  Procedure Laterality Date  . ABDOMINAL HYSTERECTOMY    . CHOLECYSTECTOMY    . COLONOSCOPY WITH PROPOFOL N/A 10/05/2019   Procedure: COLONOSCOPY WITH PROPOFOL;  Surgeon: Lin Landsman, MD;  Location: [place];  Service: Endoscopy;  Laterality: N/A;  . ESOPHAGOGASTRODUODENOSCOPY (EGD) WITH PROPOFOL N/A 10/05/2019   Procedure: ESOPHAGOGASTRODUODENOSCOPY (EGD) WITH PROPOFOL;  Surgeon: Lin Landsman, MD;  Location: [place];  Service: Endoscopy;  Laterality: N/A;  . KNEE ARTHROSCOPY WITH MENISCAL REPAIR Right 07/24/2019   Procedure: KNEE ARTHROSCOPY WITH PARTIAL MENISECTOMY CHONDROPLASTY, PATELLOFEMORAL COMPARTMENT, Plica Removal;  Surgeon: Leim Fabry, MD;  Location: [place];  Service: Orthopedics;  Laterality: Right;  sleep apnea  . NASAL SINUS SURGERY     . POLYPECTOMY  10/05/2019   Procedure: POLYPECTOMY;  Surgeon: Lin Landsman, MD;  Location: [place];  Service: Endoscopy;;    There were no vitals filed for this visit.  Subjective Assessment - 12/11/19 0850    Subjective   Sharon Barnes presents for OT Rxv visit 10/ 36 to address BLE lymphedema. Pt is on time for 50  minute appointment. pts are bbreviated by request due to time constraints related to employment. Pt does not rate pain numerically today. She reports shesaw Dr Haynes Kerns late last week and plans include abdominal CT and ultrasound to assess CVI. Pt reports she saw her PCP and was medicated for elevated BP. Pt has no new comlaints re lymphedema management today.    Pertinent History  LE relevant PMH: OSA, cpap nightly. has not been calibrated in > 2 yrs. R meniscus tear and repair. obesity,    Limitations  impaired functional ambulation and mobility;  impaired standing balance;  decreased LB muscle strength, decreased LE AROM;   chronic, progressive BLE and L arm swelling and associated pain; limited with basic and instrumental ADLs requiring standing and walking > 15 mintes; difficulty fitting street shoes and LB clothing and UB clothing, difficulty lifting, reaching, carrying to perform self care, home management and productive/ work related activities    Repetition  Increases Symptoms    Special Tests  + stemer BLE and L hand    Pain Onset  --  pain onset estimated feb 2017                  OT Treatments/Exercises (OP) - 12/11/19 0001      ADLs   ADL Education Given  Yes      Manual Therapy   Manual Therapy  Edema management;Manual Lymphatic Drainage (MLD);Compression Bandaging    Manual Lymphatic Drainage (MLD)  MLD to LUE/LUQ today utilizing short neck and functional axillary LN. Pt tolerated manual therapy without pain. No change observed at end of session.    Compression Bandaging  Wrap RLE using gradient compression techniques to the knee  with 8, 10, and 12 cm short stretch bandages over single layer of 0.4 cm Rosidal foam and stockinett.             OT Education - 12/11/19 0854    Education Details  Pt edu re compression garment options and recommendations    Person(s) Educated  Patient    Methods  Explanation;Demonstration    Comprehension  Verbalized understanding;Returned demonstration;Need further instruction          OT Long Term Goals - 12/11/19 0800      OT LONG TERM GOAL #1   Title  Pt will be able to verbalize signs and symptoms of cellulitis infection and identify 4 common lymphedema precautions using printed resource for reference (modified independence) with Max assist from spouse to limit LE progression over time.    Baseline  Max A    Time  4    Period  Days    Status  Achieved      OT LONG TERM GOAL #2   Title  to ensure optimal limb volume reduction, to limit infection risk and to limit LE progression.Pt able to apply short stretch, multi-layered compression wraps using correct gradient techniques with modified independence (extra time) after skilled training for lymphedema self care to achieve limb volume reduction and limit progression of lymphedema.    Baseline  dependent    Time  4    Period  Days    Status  Achieved      OT LONG TERM GOAL #3   Title  Pt to achieve at least 10% RLE limb volume reduction in BLE below the knees, and 10%limb volume reduction in LUE during Intensive Phase CDT to improve safe functional mobility and ambulation, to improve functional performance of basic and instrumental ADLs, to limit infection risk and chronic LE progression.    Baseline  Max A    Time  12    Period  Weeks    Status  Partially Met      OT LONG TERM GOAL #4   Title  Pt will achieve and sustain at least 85%  compliance with daily LE self-care home program (skin care, lymphatic pumping therex, compression and simple self-MLD) during Intensive Phase CDT to limit limb swelling, reduce infection  risk and limit LE progression.    Baseline  Max A    Time  12    Period  Weeks    Status  Achieved      OT LONG TERM GOAL #5   Title  Once issued Pt will be able to don and doff appropriate compression garments and/ or devices using correct techniques and assistive devices with modified independence and extra time  for optimal LE management to limit progression over time.    Baseline  Max A    Time  12    Period  Weeks  Status  On-going      OT LONG TERM GOAL #6   Title  During self-management phase of CDT Pt will retain limb volume reductions achieved during Intensive Phase CDT with no more than 3% volume increase to limit LE progression and further functional decline.    Baseline  Max A    Time  6    Period  Months    Status  On-going            Plan - 12/11/19 0857    Clinical Impression Statement  Pt demonstrates excellent progress towards all goals for Lymphedema care and home program. See note dated 4/15 and LT goal section this date for detailed assessment f progress. Pt tolerated MLD without increased pain today. Results from vascular studies to further evaluate global swelling are pending. Managed HTN should also reduce stress on lymphatic function going forward. Complete custom compression garment measurements next visit.       Patient will benefit from skilled therapeutic intervention in order to improve the following deficits and impairments:           Visit Diagnosis: Lymphedema, not elsewhere classified    Problem List Patient Active Problem List   Diagnosis Date Noted  . Chronic GERD   . Colon cancer screening   . Migraine headache 02/17/2017  . Swelling of limb 06/28/2016  . Lymphedema 06/28/2016  . Pain in limb 06/28/2016  . Hyperglycemia 02/03/2015  . DVT of lower limb, acute ([place]) 01/08/2014  . High risk medication use 01/08/2014  . Hyperlipidemia 01/08/2014  . OSA (obstructive sleep apnea) 01/08/2014    Andrey Spearman, MS, OTR/L,  [place] 12/11/19 9:01 AM  [place] MAIN [facility] SERVICES [address] [place], [place], [address] Phone: [phone]   Fax:  [phone]  Name: Sharon Barnes MRN: [id number] Date of Birth: 05-May-1966

## 2019-12-13 ENCOUNTER — Ambulatory Visit: Payer: BC Managed Care – PPO | Admitting: Occupational Therapy

## 2019-12-13 DIAGNOSIS — I10 Essential (primary) hypertension: Secondary | ICD-10-CM | POA: Insufficient documentation

## 2019-12-13 DIAGNOSIS — G629 Polyneuropathy, unspecified: Secondary | ICD-10-CM | POA: Insufficient documentation

## 2019-12-18 ENCOUNTER — Ambulatory Visit: Payer: BC Managed Care – PPO | Admitting: Occupational Therapy

## 2019-12-18 ENCOUNTER — Other Ambulatory Visit: Payer: Self-pay

## 2019-12-18 DIAGNOSIS — I890 Lymphedema, not elsewhere classified: Secondary | ICD-10-CM | POA: Diagnosis not present

## 2019-12-18 NOTE — Therapy (Signed)
[place] MAIN [facility] SERVICES [address] [place], [place], [address] Phone: [phone]   Fax:  [phone]  Occupational Therapy Treatment  Patient Details  Name: Sharon Barnes MRN: [id number] Date of Birth: [birth year] Referring Provider (OT): Eulogio Ditch, NP   Encounter Date: 12/18/2019  OT End of Session - 12/18/19 0917    Visit Number  11    Number of Visits  36    Date for OT Re-Evaluation  01/23/20    OT Start Time  0804    OT Stop Time  0855    OT Time Calculation (min)  51 min    Activity Tolerance  Patient tolerated treatment well;No increased pain    Behavior During Therapy  WFL for tasks assessed/performed       Past Medical History:  Diagnosis Date  . Arthritis    knees  . DVT (deep venous thrombosis) ([place]) 07/15/2014   Right leg  . GERD (gastroesophageal reflux disease)   . Hyperlipidemia   . Lymphedema   . Motion sickness    circular motion  . Numbness of feet   . PONV (postoperative nausea and vomiting)    after hysterectomy  . Sleep apnea    CPAP    Past Surgical History:  Procedure Laterality Date  . ABDOMINAL HYSTERECTOMY    . CHOLECYSTECTOMY    . COLONOSCOPY WITH PROPOFOL N/A 10/05/2019   Procedure: COLONOSCOPY WITH PROPOFOL;  Surgeon: Lin Landsman, MD;  Location: [place];  Service: Endoscopy;  Laterality: N/A;  . ESOPHAGOGASTRODUODENOSCOPY (EGD) WITH PROPOFOL N/A 10/05/2019   Procedure: ESOPHAGOGASTRODUODENOSCOPY (EGD) WITH PROPOFOL;  Surgeon: Lin Landsman, MD;  Location: [place];  Service: Endoscopy;  Laterality: N/A;  . KNEE ARTHROSCOPY WITH MENISCAL REPAIR Right 07/24/2019   Procedure: KNEE ARTHROSCOPY WITH PARTIAL MENISECTOMY CHONDROPLASTY, PATELLOFEMORAL COMPARTMENT, Plica Removal;  Surgeon: Leim Fabry, MD;  Location: [place];  Service: Orthopedics;  Laterality: Right;  sleep apnea  . NASAL SINUS SURGERY    . POLYPECTOMY  10/05/2019   Procedure:  POLYPECTOMY;  Surgeon: Lin Landsman, MD;  Location: [place];  Service: Endoscopy;;    There were no vitals filed for this visit.  Subjective Assessment - 12/18/19 0914    Subjective   Sharon Barnes presents for OT Rxv visit 11/ 36 to address BLE lymphedema. Pt presents with multilayer R knee length compression wrap in place. Pt reporting feeling more swollen all over. No numerical rating reported.    Pertinent History  LE relevant PMH: OSA, cpap nightly. has not been calibrated in > 2 yrs. R meniscus tear and repair. obesity,    Limitations  impaired functional ambulation and mobility;  impaired standing balance;  decreased LB muscle strength, decreased LE AROM;   chronic, progressive BLE and L arm swelling and associated pain; limited with basic and instrumental ADLs requiring standing and walking > 15 mintes; difficulty fitting street shoes and LB clothing and UB clothing, difficulty lifting, reaching, carrying to perform self care, home management and productive/ work related activities    Repetition  Increases Symptoms    Special Tests  + stemer BLE and L hand    Pain Onset  --   pain onset estimated feb 2017                  OT Treatments/Exercises (OP) - 12/18/19 0001      ADLs   ADL Education Given  Yes      Manual  Therapy   Manual Therapy  Edema management    Edema Management  Completed RLE anatomical measurements for custom flat knit comprtession knee high-ccl 2     Manual Lymphatic Drainage (MLD)  RLE as established    Compression Bandaging  Wrap RLE using gradient compression techniques to the knee with 8, 10, and 12 cm short stretch bandages over single layer of 0.4 cm Rosidal foam and stockinett.             OT Education - 12/18/19 0916    Education Details  Cont Pt edu for  custom flat knit garment vs circular OTS compression garments, including measuring and fitting process    Person(s) Educated  Patient    Methods   Explanation;Demonstration    Comprehension  Verbalized understanding;Returned demonstration;Need further instruction          OT Long Term Goals - 12/11/19 0800      OT LONG TERM GOAL #1   Title  Pt will be able to verbalize signs and symptoms of cellulitis infection and identify 4 common lymphedema precautions using printed resource for reference (modified independence) with Max assist from spouse to limit LE progression over time.    Baseline  Max A    Time  4    Period  Days    Status  Achieved      OT LONG TERM GOAL #2   Title  to ensure optimal limb volume reduction, to limit infection risk and to limit LE progression.Pt able to apply short stretch, multi-layered compression wraps using correct gradient techniques with modified independence (extra time) after skilled training for lymphedema self care to achieve limb volume reduction and limit progression of lymphedema.    Baseline  dependent    Time  4    Period  Days    Status  Achieved      OT LONG TERM GOAL #3   Title  Pt to achieve at least 10% RLE limb volume reduction in BLE below the knees, and 10%limb volume reduction in LUE during Intensive Phase CDT to improve safe functional mobility and ambulation, to improve functional performance of basic and instrumental ADLs, to limit infection risk and chronic LE progression.    Baseline  Max A    Time  12    Period  Weeks    Status  Partially Met      OT LONG TERM GOAL #4   Title  Pt will achieve and sustain at least 85%  compliance with daily LE self-care home program (skin care, lymphatic pumping therex, compression and simple self-MLD) during Intensive Phase CDT to limit limb swelling, reduce infection risk and limit LE progression.    Baseline  Max A    Time  12    Period  Weeks    Status  Achieved      OT LONG TERM GOAL #5   Title  Once issued Pt will be able to don and doff appropriate compression garments and/ or devices using correct techniques and assistive  devices with modified independence and extra time  for optimal LE management to limit progression over time.    Baseline  Max A    Time  12    Period  Weeks    Status  On-going      OT LONG TERM GOAL #6   Title  During self-management phase of CDT Pt will retain limb volume reductions achieved during Intensive Phase CDT with no more than 3% volume increase to  limit LE progression and further functional decline.    Baseline  Max A    Time  6    Period  Months    Status  On-going            Plan - 12/18/19 1158    Clinical Impression Statement  RLE limb volume well managed. Volume reduction appears to be approaching plateau. Completed anatomical measurements to RLE , custom, flat knit, [employer], Elvarex SOFT, ccl 2 (23-32 mmHg) compression garment and submitted to vendor along with referral info and demographics. Provided MLD to RLE for remaining minutes of session, which was abbreviated due to work schedule lohistics by Pt request. Cont as per POC. Transition RLE to Management Phase CDT after garment fitting and commence intensive phase on LLE.       Patient will benefit from skilled therapeutic intervention in order to improve the following deficits and impairments:           Visit Diagnosis: Lymphedema, not elsewhere classified    Problem List Patient Active Problem List   Diagnosis Date Noted  . Chronic GERD   . Colon cancer screening   . Migraine headache 02/17/2017  . Swelling of limb 06/28/2016  . Lymphedema 06/28/2016  . Pain in limb 06/28/2016  . Hyperglycemia 02/03/2015  . DVT of lower limb, acute ([place]) 01/08/2014  . High risk medication use 01/08/2014  . Hyperlipidemia 01/08/2014  . OSA (obstructive sleep apnea) 01/08/2014    Andrey Spearman, MS, OTR/L, [facility] 12/18/19 12:03 PM  [place] MAIN [facility] SERVICES [address] [place], [place], [address] Phone: [phone]   Fax:  [phone]  Name: Sharon Barnes MRN: [id number] Date of Birth: [birth year]

## 2019-12-20 ENCOUNTER — Ambulatory Visit: Payer: BC Managed Care – PPO | Admitting: Occupational Therapy

## 2019-12-20 ENCOUNTER — Encounter: Payer: Self-pay | Admitting: Gastroenterology

## 2019-12-20 ENCOUNTER — Ambulatory Visit (INDEPENDENT_AMBULATORY_CARE_PROVIDER_SITE_OTHER): Payer: BC Managed Care – PPO | Admitting: Gastroenterology

## 2019-12-20 ENCOUNTER — Other Ambulatory Visit: Payer: Self-pay

## 2019-12-20 DIAGNOSIS — I890 Lymphedema, not elsewhere classified: Secondary | ICD-10-CM

## 2019-12-20 DIAGNOSIS — K219 Gastro-esophageal reflux disease without esophagitis: Secondary | ICD-10-CM

## 2019-12-20 DIAGNOSIS — K641 Second degree hemorrhoids: Secondary | ICD-10-CM | POA: Diagnosis not present

## 2019-12-20 DIAGNOSIS — K5909 Other constipation: Secondary | ICD-10-CM

## 2019-12-20 NOTE — Progress Notes (Signed)
Cephas Darby, MD [address]  [place]  [place], [place] [address]  Main: [phone]  Fax: [phone]    Gastroenterology Consultation  Referring Provider:     Idelle Crouch, MD Primary Care Physician:  Idelle Crouch, MD Primary Gastroenterologist:  Dr. Cephas Darby Reason for Consultation: Symptomatic hemorrhoids, chronic GERD        HPI:   Sharon Barnes is a 54 y.o. female referred by Dr. Doy Hutching, Leonie Douglas, MD  for consultation & management of symptomatic hemorrhoids and chronic GERD.  Patient reports that she has been suffering from hemorrhoidal symptoms including perianal itching, discomfort, prolapse, burning.  Also, she denies constipation, she does report straining.  She denies rectal bleeding.  She does report flareup of the symptoms about once or twice a month that are partially relieved with over-the-counter hemorrhoidal creams.  She wanted to discuss about hemorrhoid management before undergoing screening colonoscopy.  She also has history of chronic GERD, OSA on CPAP, nocturnal heartburn.  Her symptoms are responding to antireflux lifestyle, she is following dietary modifications which she thinks is helping.  She denies dysphagia, nausea or vomiting.  She does report abdominal bloating, denies abdominal pain.  She does have remote history of DVT with hypercoagulable work-up negative, history of lymphedema in lower extremities, seeing Dr. Festus Holts for lymphedema in her left upper arm and is scheduled to undergo ultrasound next week.  Patient is also evaluated by cardiology in the past for swelling of legs and cardiac work-up was negative  Patient is a high school teacher and teaches business  She does not smoke, alcohol occasionally  Follow-up visit 12/21/2019 Patient has been doing well with regards to her GERD symptoms. She reports that omeprazole 40 mg twice daily has resolved her reflux symptoms.  She also reports that her rectal bleeding has resolved.   She would like to defer hemorrhoid ligation at this time  NSAIDs: None  Antiplts/Anticoagulants/Anti thrombotics: None  GI Procedures: Reports having had an upper endoscopy more than 15 years ago EGD and colonoscopy 10/05/2019 - Normal duodenal bulb and second portion of the duodenum. - Erythematous mucosa in the gastric body. Biopsied. - Esophagogastric landmarks identified. - Normal gastroesophageal junction and esophagus. Biopsied.  - Perianal skin tags found on perianal exam. - The examined portion of the ileum was normal. - One 5 mm polyp in the cecum, removed with mucosal resection. Resected and retrieved. - One 6 mm polyp in the ascending colon, removed with a cold snare. Resected and retrieved. - Non-bleeding external hemorrhoids. - The examination was otherwise normal. - Mucosal resection was performed. Resection and retrieval were complete.  DIAGNOSIS:  A. STOMACH, RANDOM; BIOPSY:  - OXYNTIC MUCOSA WITH NO SIGNIFICANT PATHOLOGIC ALTERATION.  - NEGATIVE FOR ACTIVE INFLAMMATION AND H PYLORI.  - NEGATIVE FOR INTESTINAL METAPLASIA, DYSPLASIA, AND MALIGNANCY.   B. ESOPHAGUS, RANDOM; BIOPSY:  - SQUAMOUS MUCOSA WITH NO SIGNIFICANT PATHOLOGIC ALTERATION.  - NEGATIVE FOR INFLAMMATION, INTESTINAL METAPLASIA, DYSPLASIA, AND  MALIGNANCY.   C. COLON POLYPS X2, CECUM AND ASCENDING; COLD SNARE:  - FRAGMENTS WITH FEATURES SUGGESTIVE OF SESSILE SERRATED POLYP (2).  - FRAGMENTS OF BENIGN INFLAMMATORY POLYP (2).  - NEGATIVE FOR DYSPLASIA AND MALIGNANCY.  She denies family history of GI malignancy  Past Medical History:  Diagnosis Date  . Arthritis    knees  . DVT (deep venous thrombosis) ([place]) 07/15/2014   Right leg  . GERD (gastroesophageal reflux disease)   . Hyperlipidemia   . Lymphedema   .  Motion sickness    circular motion  . Numbness of feet   . PONV (postoperative nausea and vomiting)    after hysterectomy  . Sleep apnea    CPAP    Past Surgical History:    Procedure Laterality Date  . ABDOMINAL HYSTERECTOMY    . CHOLECYSTECTOMY    . COLONOSCOPY WITH PROPOFOL N/A 10/05/2019   Procedure: COLONOSCOPY WITH PROPOFOL;  Surgeon: Lin Landsman, MD;  Location: [place];  Service: Endoscopy;  Laterality: N/A;  . ESOPHAGOGASTRODUODENOSCOPY (EGD) WITH PROPOFOL N/A 10/05/2019   Procedure: ESOPHAGOGASTRODUODENOSCOPY (EGD) WITH PROPOFOL;  Surgeon: Lin Landsman, MD;  Location: [place];  Service: Endoscopy;  Laterality: N/A;  . KNEE ARTHROSCOPY WITH MENISCAL REPAIR Right 07/24/2019   Procedure: KNEE ARTHROSCOPY WITH PARTIAL MENISECTOMY CHONDROPLASTY, PATELLOFEMORAL COMPARTMENT, Plica Removal;  Surgeon: Leim Fabry, MD;  Location: [place];  Service: Orthopedics;  Laterality: Right;  sleep apnea  . NASAL SINUS SURGERY    . POLYPECTOMY  10/05/2019   Procedure: POLYPECTOMY;  Surgeon: Lin Landsman, MD;  Location: [place];  Service: Endoscopy;;    Current Outpatient Medications:  .  cetirizine (ZYRTEC) 10 MG tablet, Take by mouth., Disp: , Rfl:  .  clonazePAM (KLONOPIN) 0.5 MG tablet, Take by mouth as needed. , Disp: , Rfl:  .  Cyanocobalamin (RA VITAMIN B-12 TR) 1000 MCG TBCR, Take by mouth., Disp: , Rfl:  .  fluticasone (FLONASE) 50 MCG/ACT nasal spray, Place into both nostrils 2 (two) times daily., Disp: , Rfl:  .  gabapentin (NEURONTIN) 300 MG capsule, gabapentin 300 mg capsule   300 mg every day by oral route., Disp: , Rfl:  .  hydrochlorothiazide (MICROZIDE) 12.5 MG capsule, hydrochlorothiazide 12.5 mg capsule   1 capsule every day by oral route., Disp: , Rfl:  .  Multiple Vitamin (MULTI-VITAMINS) TABS, Take by mouth., Disp: , Rfl:  .  omeprazole (PRILOSEC) 40 MG capsule, Take 1 capsule (40 mg total) by mouth 2 (two) times daily before a meal., Disp: 60 capsule, Rfl: 2   Family History  Problem Relation Age of Onset  . Heart disease Mother   . Cancer Father   . Heart disease Maternal  Grandmother   . Cancer Maternal Grandfather   . Emphysema Paternal Grandmother      Social History   Tobacco Use  . Smoking status: Never Smoker  . Smokeless tobacco: Never Used  Substance Use Topics  . Alcohol use: Yes    Comment: 1x/month  . Drug use: No    Allergies as of 12/20/2019  . (No Known Allergies)    Review of Systems:    All systems reviewed and negative except where noted in HPI.   Physical Exam:  There were no vitals taken for this visit. No LMP recorded. Patient has had a hysterectomy.  General:   Alert,  Well-developed, well-nourished, pleasant and cooperative in NAD Head:  Normocephalic and atraumatic. Eyes:  Sclera clear, no icterus.   Conjunctiva pink. Ears:  Normal auditory acuity. Nose:  No deformity, discharge, or lesions. Mouth:  No deformity or lesions,oropharynx pink & moist. Neck:  Supple; short and thick neck, no masses or thyromegaly. Lungs:  Respirations even and unlabored.  Clear throughout to auscultation.   No wheezes, crackles, or rhonchi. No acute distress. Heart:  Regular rate and rhythm; no murmurs, clicks, rubs, or gallops. Abdomen:  Normal bowel sounds. Soft, obese, non-tender and diffusely distended, tympanic to percussion without masses, hepatosplenomegaly or hernias noted.  No guarding or rebound tenderness.   Rectal: Not performed Msk:  Symmetrical without gross deformities. Good, equal movement & strength bilaterally. Pulses:  Normal pulses noted. Extremities:  No clubbing or edema.  No cyanosis. Neurologic:  Alert and oriented x3;  grossly normal neurologically. Skin:  Intact without significant lesions or rashes. No jaundice. Psych:  Alert and cooperative. Normal mood and affect.  Imaging Studies: Reviewed  Assessment and Plan:   Sharon Barnes is a 54 y.o. female with obesity, BMI 36, OSA on CPAP, lymphedema, remote history of DVT is seen for follow-up of symptomatic external hemorrhoids and chronic GERD  Symptomatic  external hemorrhoids: Patient reports that she has been doing well overall since last visit with no flareups She is following measures as discussed about high-fiber diet and adequate intake of water to keep bowels regular, advised on proper toilet hygiene Patient would like to defer hemorrhoid ligation at this time  Chronic GERD, nonerosive, EGD unremarkable, no evidence of Barrett's Well-controlled on omeprazole 40 mg twice daily, started in 2/21, suggested to decrease to once a day for about a month, then stop.  If her symptoms recur, will pursue pH impedance study Continue antireflux measures as she is doing   Follow up as needed   Cephas Darby, MD

## 2019-12-20 NOTE — Therapy (Signed)
[place] MAIN [facility] SERVICES [address] [place], [place], [address] Phone: [phone]   Fax:  [phone]  Occupational Therapy Treatment  Patient Details  Name: Sharon Barnes MRN: [id number] Date of Birth: Nov 24, 1965 Referring Provider (OT): Eulogio Ditch, NP   Encounter Date: 12/20/2019  OT End of Session - 12/20/19 0908    Visit Number  12    Number of Visits  36    Date for OT Re-Evaluation  01/23/20    OT Start Time  0803    OT Stop Time  0845    OT Time Calculation (min)  42 min    Activity Tolerance  Patient tolerated treatment well;No increased pain    Behavior During Therapy  WFL for tasks assessed/performed       Past Medical History:  Diagnosis Date  . Arthritis    knees  . DVT (deep venous thrombosis) ([place]) 07/15/2014   Right leg  . GERD (gastroesophageal reflux disease)   . Hyperlipidemia   . Lymphedema   . Motion sickness    circular motion  . Numbness of feet   . PONV (postoperative nausea and vomiting)    after hysterectomy  . Sleep apnea    CPAP    Past Surgical History:  Procedure Laterality Date  . ABDOMINAL HYSTERECTOMY    . CHOLECYSTECTOMY    . COLONOSCOPY WITH PROPOFOL N/A 10/05/2019   Procedure: COLONOSCOPY WITH PROPOFOL;  Surgeon: Lin Landsman, MD;  Location: [place];  Service: Endoscopy;  Laterality: N/A;  . ESOPHAGOGASTRODUODENOSCOPY (EGD) WITH PROPOFOL N/A 10/05/2019   Procedure: ESOPHAGOGASTRODUODENOSCOPY (EGD) WITH PROPOFOL;  Surgeon: Lin Landsman, MD;  Location: [place];  Service: Endoscopy;  Laterality: N/A;  . KNEE ARTHROSCOPY WITH MENISCAL REPAIR Right 07/24/2019   Procedure: KNEE ARTHROSCOPY WITH PARTIAL MENISECTOMY CHONDROPLASTY, PATELLOFEMORAL COMPARTMENT, Plica Removal;  Surgeon: Leim Fabry, MD;  Location: [place];  Service: Orthopedics;  Laterality: Right;  sleep apnea  . NASAL SINUS SURGERY    . POLYPECTOMY  10/05/2019   Procedure:  POLYPECTOMY;  Surgeon: Lin Landsman, MD;  Location: [place];  Service: Endoscopy;;    There were no vitals filed for this visit.  Subjective Assessment - 12/20/19 0905    Subjective   Sharon Barnes presents for OT Rxv visit 12/ 36 to address BLE lymphedema. Pt presents with multilayer R knee length compression wrap in place. pT NOTIFIED BY dme VENDOR THAT sTATE bcbs NO LONGER COVERS CUSTOM  COMPRESSION, day or HOS. We discussed alternative options and plans throughout session.    Pertinent History  LE relevant PMH: OSA, cpap nightly. has not been calibrated in > 2 yrs. R meniscus tear and repair. obesity,    Limitations  impaired functional ambulation and mobility;  impaired standing balance;  decreased LB muscle strength, decreased LE AROM;   chronic, progressive BLE and L arm swelling and associated pain; limited with basic and instrumental ADLs requiring standing and walking > 15 mintes; difficulty fitting street shoes and LB clothing and UB clothing, difficulty lifting, reaching, carrying to perform self care, home management and productive/ work related activities    Repetition  Increases Symptoms    Special Tests  + stemer BLE and L hand    Pain Onset  --   pain onset estimated feb 2017                  OT Treatments/Exercises (OP) - 12/20/19 0001  ADLs   ADL Education Given  Yes      Manual Therapy   Manual Therapy  Edema management    Manual Lymphatic Drainage (MLD)  RLE as established    Compression Bandaging  Wrap RLE using gradient compression techniques to the knee with 8, 10, and 12 cm short stretch bandages over single layer of 0.4 cm Rosidal foam and stockinett.             OT Education - 12/20/19 0907    Education Details  Cont Pt edu for  custom flat knit garment vs circular OTS compression garments, including measuring and fitting process    Person(s) Educated  Patient    Methods  Explanation;Demonstration    Comprehension   Verbalized understanding;Returned demonstration;Need further instruction          OT Long Term Goals - 12/11/19 0800      OT LONG TERM GOAL #1   Title  Pt will be able to verbalize signs and symptoms of cellulitis infection and identify 4 common lymphedema precautions using printed resource for reference (modified independence) with Max assist from spouse to limit LE progression over time.    Baseline  Max A    Time  4    Period  Days    Status  Achieved      OT LONG TERM GOAL #2   Title  to ensure optimal limb volume reduction, to limit infection risk and to limit LE progression.Pt able to apply short stretch, multi-layered compression wraps using correct gradient techniques with modified independence (extra time) after skilled training for lymphedema self care to achieve limb volume reduction and limit progression of lymphedema.    Baseline  dependent    Time  4    Period  Days    Status  Achieved      OT LONG TERM GOAL #3   Title  Pt to achieve at least 10% RLE limb volume reduction in BLE below the knees, and 10%limb volume reduction in LUE during Intensive Phase CDT to improve safe functional mobility and ambulation, to improve functional performance of basic and instrumental ADLs, to limit infection risk and chronic LE progression.    Baseline  Max A    Time  12    Period  Weeks    Status  Partially Met      OT LONG TERM GOAL #4   Title  Pt will achieve and sustain at least 85%  compliance with daily LE self-care home program (skin care, lymphatic pumping therex, compression and simple self-MLD) during Intensive Phase CDT to limit limb swelling, reduce infection risk and limit LE progression.    Baseline  Max A    Time  12    Period  Weeks    Status  Achieved      OT LONG TERM GOAL #5   Title  Once issued Pt will be able to don and doff appropriate compression garments and/ or devices using correct techniques and assistive devices with modified independence and extra time   for optimal LE management to limit progression over time.    Baseline  Max A    Time  12    Period  Weeks    Status  On-going      OT LONG TERM GOAL #6   Title  During self-management phase of CDT Pt will retain limb volume reductions achieved during Intensive Phase CDT with no more than 3% volume increase to limit LE progression and further  functional decline.    Baseline  Max A    Time  6    Period  Months    Status  On-going            Plan - 12/20/19 0909    Clinical Impression Statement  Provided Pt edu re custom vs OTS compression garments, measurment and fitting processes throughout session during MLD to RLE. Pt reports she's had more fluctuating swelling this week, which is likely due to warmer outdoor temperatures, but by visia assessment and palpation swelling below the knee is very well managed during visit interval, and in general. Next visit we'll complete anatomical measurements for OTS Juzo ccl 2 DYNAMIC knee highs , and complete [employer] application.       Patient will benefit from skilled therapeutic intervention in order to improve the following deficits and impairments:           Visit Diagnosis: Lymphedema, not elsewhere classified    Problem List Patient Active Problem List   Diagnosis Date Noted  . Chronic GERD   . Colon cancer screening   . Migraine headache 02/17/2017  . Swelling of limb 06/28/2016  . Lymphedema 06/28/2016  . Pain in limb 06/28/2016  . Hyperglycemia 02/03/2015  . DVT of lower limb, acute ([place]) 01/08/2014  . High risk medication use 01/08/2014  . Hyperlipidemia 01/08/2014  . OSA (obstructive sleep apnea) 01/08/2014    Andrey Spearman, MS, OTR/L, [facility] 12/20/19 9:13 AM  [place] MAIN [facility] SERVICES [address] [place], [place], [address] Phone: [phone]   Fax:  [phone]  Name: Sharon Barnes MRN: [id number] Date of Birth: [birth year]

## 2019-12-25 ENCOUNTER — Other Ambulatory Visit: Payer: Self-pay

## 2019-12-25 ENCOUNTER — Ambulatory Visit: Payer: BC Managed Care – PPO | Attending: Nurse Practitioner | Admitting: Occupational Therapy

## 2019-12-25 DIAGNOSIS — I890 Lymphedema, not elsewhere classified: Secondary | ICD-10-CM | POA: Insufficient documentation

## 2019-12-25 NOTE — Therapy (Signed)
[place] MAIN [facility] SERVICES [address] [place], [place], [address] Phone: [phone]   Fax:  [phone]  Occupational Therapy Treatment  Patient Details  Name: Sharon Barnes MRN: [id number] Date of Birth: 1966/05/08 Referring Provider (OT): Eulogio Ditch, NP   Encounter Date: 12/25/2019  OT End of Session - 12/25/19 0851    Visit Number  13    Number of Visits  36    Date for OT Re-Evaluation  01/23/20    OT Start Time  0807    OT Stop Time  0847    OT Time Calculation (min)  40 min    Activity Tolerance  Patient tolerated treatment well;No increased pain    Behavior During Therapy  WFL for tasks assessed/performed       Past Medical History:  Diagnosis Date  . Arthritis    knees  . DVT (deep venous thrombosis) ([place]) 07/15/2014   Right leg  . GERD (gastroesophageal reflux disease)   . Hyperlipidemia   . Lymphedema   . Motion sickness    circular motion  . Numbness of feet   . PONV (postoperative nausea and vomiting)    after hysterectomy  . Sleep apnea    CPAP    Past Surgical History:  Procedure Laterality Date  . ABDOMINAL HYSTERECTOMY    . CHOLECYSTECTOMY    . COLONOSCOPY WITH PROPOFOL N/A 10/05/2019   Procedure: COLONOSCOPY WITH PROPOFOL;  Surgeon: Lin Landsman, MD;  Location: [place];  Service: Endoscopy;  Laterality: N/A;  . ESOPHAGOGASTRODUODENOSCOPY (EGD) WITH PROPOFOL N/A 10/05/2019   Procedure: ESOPHAGOGASTRODUODENOSCOPY (EGD) WITH PROPOFOL;  Surgeon: Lin Landsman, MD;  Location: [place];  Service: Endoscopy;  Laterality: N/A;  . KNEE ARTHROSCOPY WITH MENISCAL REPAIR Right 07/24/2019   Procedure: KNEE ARTHROSCOPY WITH PARTIAL MENISECTOMY CHONDROPLASTY, PATELLOFEMORAL COMPARTMENT, Plica Removal;  Surgeon: Leim Fabry, MD;  Location: [place];  Service: Orthopedics;  Laterality: Right;  sleep apnea  . NASAL SINUS SURGERY    . POLYPECTOMY  10/05/2019   Procedure:  POLYPECTOMY;  Surgeon: Lin Landsman, MD;  Location: [place];  Service: Endoscopy;;    There were no vitals filed for this visit.  Subjective Assessment - 12/25/19 0600    Subjective   [name] presents for OT Rxv visit 13/ 36 to address BLE lymphedema. Pt presents without multilayer R knee length compression wrap in place. PT REPORTS 2/10 LEG PAIN THIS MORNING. "The top of my R foot is red and sore." Pt agrees with plan to complete anatomical measurements for knee length, OTS compression stockings.    Pertinent History  LE relevant PMH: OSA, cpap nightly. has not been calibrated in > 2 yrs. R meniscus tear and repair. obesity,    Limitations  impaired functional ambulation and mobility;  impaired standing balance;  decreased LB muscle strength, decreased LE AROM;   chronic, progressive BLE and L arm swelling and associated pain; limited with basic and instrumental ADLs requiring standing and walking > 15 mintes; difficulty fitting street shoes and LB clothing and UB clothing, difficulty lifting, reaching, carrying to perform self care, home management and productive/ work related activities    Repetition  Increases Symptoms    Special Tests  + stemer BLE and L hand    Currently in Pain?  Yes    Pain Score  2     Pain Location  Leg    Pain Orientation  Right;Left    Pain Onset  --  pain onset estimated feb 2017                  OT Treatments/Exercises (OP) - 12/25/19 0001      ADLs   ADL Education Given  Yes      Manual Therapy   Manual Therapy  Edema management    Manual therapy comments  completed BLE anatomical measurements for OTS Juzo knee highs-ccl 2    Compression Bandaging  Wrap RLE using gradient compression techniques to the knee with 8, 10, and 12 cm short stretch bandages over single layer of 0.4 cm Rosidal foam and stockinett.             OT Education - 12/25/19 0851    Education Details  Pt edu re compression garment options  and recommendations    Person(s) Educated  Patient    Methods  Explanation;Demonstration    Comprehension  Verbalized understanding;Returned demonstration;Need further instruction          OT Long Term Goals - 12/11/19 0800      OT LONG TERM GOAL #1   Title  Pt will be able to verbalize signs and symptoms of cellulitis infection and identify 4 common lymphedema precautions using printed resource for reference (modified independence) with Max assist from spouse to limit LE progression over time.    Baseline  Max A    Time  4    Period  Days    Status  Achieved      OT LONG TERM GOAL #2   Title  to ensure optimal limb volume reduction, to limit infection risk and to limit LE progression.Pt able to apply short stretch, multi-layered compression wraps using correct gradient techniques with modified independence (extra time) after skilled training for lymphedema self care to achieve limb volume reduction and limit progression of lymphedema.    Baseline  dependent    Time  4    Period  Days    Status  Achieved      OT LONG TERM GOAL #3   Title  Pt to achieve at least 10% RLE limb volume reduction in BLE below the knees, and 10%limb volume reduction in LUE during Intensive Phase CDT to improve safe functional mobility and ambulation, to improve functional performance of basic and instrumental ADLs, to limit infection risk and chronic LE progression.    Baseline  Max A    Time  12    Period  Weeks    Status  Partially Met      OT LONG TERM GOAL #4   Title  Pt will achieve and sustain at least 85%  compliance with daily LE self-care home program (skin care, lymphatic pumping therex, compression and simple self-MLD) during Intensive Phase CDT to limit limb swelling, reduce infection risk and limit LE progression.    Baseline  Max A    Time  12    Period  Weeks    Status  Achieved      OT LONG TERM GOAL #5   Title  Once issued Pt will be able to don and doff appropriate compression  garments and/ or devices using correct techniques and assistive devices with modified independence and extra time  for optimal LE management to limit progression over time.    Baseline  Max A    Time  12    Period  Weeks    Status  On-going      OT LONG TERM GOAL #6   Title  During  self-management phase of CDT Pt will retain limb volume reductions achieved during Intensive Phase CDT with no more than 3% volume increase to limit LE progression and further functional decline.    Baseline  Max A    Time  6    Period  Months    Status  On-going            Plan - 12/25/19 3244    Clinical Impression Statement  Completed anatomical measurements for 1 pr BLE, Juzo, ccl 2 (30-40 mmHg) , off-the-shelf, SOFT (2002) knee length compression stockings with silicone top band and closed toe. Applied RLEcompression wraps as established. Provided web links for online compression garment vendors with knowledgable customer service. Cont as per POC.       Patient will benefit from skilled therapeutic intervention in order to improve the following deficits and impairments:           Visit Diagnosis: Lymphedema, not elsewhere classified    Problem List Patient Active Problem List   Diagnosis Date Noted  . Hypertensive disorder 12/13/2019  . Neuropathy 12/13/2019  . Chronic GERD   . Migraine headache 02/17/2017  . Swelling of limb 06/28/2016  . Lymphedema 06/28/2016  . Pain in limb 06/28/2016  . Hyperglycemia 02/03/2015  . DVT of lower limb, acute ([place]) 01/08/2014  . High risk medication use 01/08/2014  . Hyperlipidemia 01/08/2014  . OSA (obstructive sleep apnea) 01/08/2014    Andrey Spearman, MS, OTR/L, [facility] 12/25/19 8:55 AM   [place] MAIN [facility] SERVICES [address] [place], [place], [address] Phone: [phone]   Fax:  [phone]  Name: AAVA DELAND MRN: [id number] Date of Birth: [birth year]

## 2019-12-27 ENCOUNTER — Ambulatory Visit: Payer: BC Managed Care – PPO | Admitting: Occupational Therapy

## 2019-12-27 ENCOUNTER — Other Ambulatory Visit: Payer: Self-pay

## 2019-12-27 DIAGNOSIS — I890 Lymphedema, not elsewhere classified: Secondary | ICD-10-CM

## 2019-12-27 NOTE — Therapy (Signed)
[place] MAIN [facility] SERVICES [address] [place], [place], [address] Phone: [phone]   Fax:  [phone]  Occupational Therapy Treatment  Patient Details  Name: Sharon Barnes MRN: [id number] Date of Birth: Sep 07, 1965 Referring Provider (OT): Eulogio Ditch, NP   Encounter Date: 12/27/2019  OT End of Session - 12/27/19 0857    Visit Number  14    Number of Visits  36    Date for OT Re-Evaluation  01/23/20    OT Start Time  0809    OT Stop Time  0854    OT Time Calculation (min)  45 min    Activity Tolerance  Patient tolerated treatment well;No increased pain    Behavior During Therapy  WFL for tasks assessed/performed       Past Medical History:  Diagnosis Date  . Arthritis    knees  . DVT (deep venous thrombosis) ([place]) 07/15/2014   Right leg  . GERD (gastroesophageal reflux disease)   . Hyperlipidemia   . Lymphedema   . Motion sickness    circular motion  . Numbness of feet   . PONV (postoperative nausea and vomiting)    after hysterectomy  . Sleep apnea    CPAP    Past Surgical History:  Procedure Laterality Date  . ABDOMINAL HYSTERECTOMY    . CHOLECYSTECTOMY    . COLONOSCOPY WITH PROPOFOL N/A 10/05/2019   Procedure: COLONOSCOPY WITH PROPOFOL;  Surgeon: Lin Landsman, MD;  Location: [place];  Service: Endoscopy;  Laterality: N/A;  . ESOPHAGOGASTRODUODENOSCOPY (EGD) WITH PROPOFOL N/A 10/05/2019   Procedure: ESOPHAGOGASTRODUODENOSCOPY (EGD) WITH PROPOFOL;  Surgeon: Lin Landsman, MD;  Location: [place];  Service: Endoscopy;  Laterality: N/A;  . KNEE ARTHROSCOPY WITH MENISCAL REPAIR Right 07/24/2019   Procedure: KNEE ARTHROSCOPY WITH PARTIAL MENISECTOMY CHONDROPLASTY, PATELLOFEMORAL COMPARTMENT, Plica Removal;  Surgeon: Leim Fabry, MD;  Location: [place];  Service: Orthopedics;  Laterality: Right;  sleep apnea  . NASAL SINUS SURGERY    . POLYPECTOMY  10/05/2019   Procedure:  POLYPECTOMY;  Surgeon: Lin Landsman, MD;  Location: [place];  Service: Endoscopy;;    There were no vitals filed for this visit.  Subjective Assessment - 12/27/19 0855    Subjective   Malasha Kleppe presents for OT Rxv visit 14/ 36 to address BLE lymphedema. Pt presents with multilayer R knee length compression wrap in place. Pt does not rate leg pain this morning. She tells me she was able to order ccl 2 OTS compression knee highs online from a resource provided at last visit.    Pertinent History  LE relevant PMH: OSA, cpap nightly. has not been calibrated in > 2 yrs. R meniscus tear and repair. obesity,    Limitations  impaired functional ambulation and mobility;  impaired standing balance;  decreased LB muscle strength, decreased LE AROM;   chronic, progressive BLE and L arm swelling and associated pain; limited with basic and instrumental ADLs requiring standing and walking > 15 mintes; difficulty fitting street shoes and LB clothing and UB clothing, difficulty lifting, reaching, carrying to perform self care, home management and productive/ work related activities    Repetition  Increases Symptoms    Special Tests  + stemer BLE and L hand    Pain Onset  --   pain onset estimated feb 2017  OT Education - 12/27/19 0856    Education Details  Edu for simple self MLD- entire non-cancer LE sequence. good return    Person(s) Educated  Patient    Methods  Explanation;Demonstration    Comprehension  Verbalized understanding;Returned demonstration;Need further instruction          OT Long Term Goals - 12/11/19 0800      OT LONG TERM GOAL #1   Title  Pt will be able to verbalize signs and symptoms of cellulitis infection and identify 4 common lymphedema precautions using printed resource for reference (modified independence) with Max assist from spouse to limit LE progression over time.    Baseline  Max A    Time  4    Period   Days    Status  Achieved      OT LONG TERM GOAL #2   Title  to ensure optimal limb volume reduction, to limit infection risk and to limit LE progression.Pt able to apply short stretch, multi-layered compression wraps using correct gradient techniques with modified independence (extra time) after skilled training for lymphedema self care to achieve limb volume reduction and limit progression of lymphedema.    Baseline  dependent    Time  4    Period  Days    Status  Achieved      OT LONG TERM GOAL #3   Title  Pt to achieve at least 10% RLE limb volume reduction in BLE below the knees, and 10%limb volume reduction in LUE during Intensive Phase CDT to improve safe functional mobility and ambulation, to improve functional performance of basic and instrumental ADLs, to limit infection risk and chronic LE progression.    Baseline  Max A    Time  12    Period  Weeks    Status  Partially Met      OT LONG TERM GOAL #4   Title  Pt will achieve and sustain at least 85%  compliance with daily LE self-care home program (skin care, lymphatic pumping therex, compression and simple self-MLD) during Intensive Phase CDT to limit limb swelling, reduce infection risk and limit LE progression.    Baseline  Max A    Time  12    Period  Weeks    Status  Achieved      OT LONG TERM GOAL #5   Title  Once issued Pt will be able to don and doff appropriate compression garments and/ or devices using correct techniques and assistive devices with modified independence and extra time  for optimal LE management to limit progression over time.    Baseline  Max A    Time  12    Period  Weeks    Status  On-going      OT LONG TERM GOAL #6   Title  During self-management phase of CDT Pt will retain limb volume reductions achieved during Intensive Phase CDT with no more than 3% volume increase to limit LE progression and further functional decline.    Baseline  Max A    Time  6    Period  Months    Status  On-going             Plan - 12/27/19 0857    Clinical Impression Statement  Pt able to perform J stroke and short neck sequence. We'll cont to teach simple self MLD each session until she is able to perform entire leg sequence. RLE well reduced today, so emphasis of MLD was on  LLE . Pt tolerated MLD without  increased pain. Wraps reapplied to RLE. Fit compression ASAP. Cont as per POC.       Patient will benefit from skilled therapeutic intervention in order to improve the following deficits and impairments:           Visit Diagnosis: Lymphedema, not elsewhere classified    Problem List Patient Active Problem List   Diagnosis Date Noted  . Hypertensive disorder 12/13/2019  . Neuropathy 12/13/2019  . Chronic GERD   . Migraine headache 02/17/2017  . Swelling of limb 06/28/2016  . Lymphedema 06/28/2016  . Pain in limb 06/28/2016  . Hyperglycemia 02/03/2015  . DVT of lower limb, acute ([place]) 01/08/2014  . High risk medication use 01/08/2014  . Hyperlipidemia 01/08/2014  . OSA (obstructive sleep apnea) 01/08/2014    Andrey Spearman, MS, OTR/L, [facility] 12/27/19 9:00 AM  [place] MAIN [facility] SERVICES [address] [place], [place], [address] Phone: [phone]   Fax:  [phone]  Name: BLAKELYNN SCHEELER MRN: [id number] Date of Birth: December 08, 1965

## 2019-12-28 ENCOUNTER — Other Ambulatory Visit: Payer: Self-pay | Admitting: Gastroenterology

## 2019-12-28 NOTE — Telephone Encounter (Signed)
Last office visit 12/20/2019 chronic gerd  Last refill 10/05/2019 2 refills

## 2020-01-01 ENCOUNTER — Other Ambulatory Visit: Payer: Self-pay

## 2020-01-01 ENCOUNTER — Ambulatory Visit (INDEPENDENT_AMBULATORY_CARE_PROVIDER_SITE_OTHER): Payer: BC Managed Care – PPO | Admitting: Vascular Surgery

## 2020-01-01 ENCOUNTER — Ambulatory Visit: Payer: BC Managed Care – PPO | Admitting: Occupational Therapy

## 2020-01-01 DIAGNOSIS — I890 Lymphedema, not elsewhere classified: Secondary | ICD-10-CM | POA: Diagnosis not present

## 2020-01-01 NOTE — Therapy (Signed)
[place] MAIN [facility] SERVICES [address] [place], [place], [address] Phone: [phone]   Fax:  [phone]  Occupational Therapy Treatment  Patient Details  Name: Sharon Barnes MRN: [id number] Date of Birth: 1966-03-02 Referring Provider (OT): Eulogio Ditch, NP   Encounter Date: 01/01/2020  OT End of Session - 01/01/20 0859    Visit Number  15    Number of Visits  36    Date for OT Re-Evaluation  01/23/20    OT Start Time  0817    OT Stop Time  0858    OT Time Calculation (min)  41 min    Equipment Utilized During Treatment  friction gloves    Activity Tolerance  Patient tolerated treatment well;No increased pain    Behavior During Therapy  WFL for tasks assessed/performed       Past Medical History:  Diagnosis Date  . Arthritis    knees  . DVT (deep venous thrombosis) ([place]) 07/15/2014   Right leg  . GERD (gastroesophageal reflux disease)   . Hyperlipidemia   . Lymphedema   . Motion sickness    circular motion  . Numbness of feet   . PONV (postoperative nausea and vomiting)    after hysterectomy  . Sleep apnea    CPAP    Past Surgical History:  Procedure Laterality Date  . ABDOMINAL HYSTERECTOMY    . CHOLECYSTECTOMY    . COLONOSCOPY WITH PROPOFOL N/A 10/05/2019   Procedure: COLONOSCOPY WITH PROPOFOL;  Surgeon: Lin Landsman, MD;  Location: [place];  Service: Endoscopy;  Laterality: N/A;  . ESOPHAGOGASTRODUODENOSCOPY (EGD) WITH PROPOFOL N/A 10/05/2019   Procedure: ESOPHAGOGASTRODUODENOSCOPY (EGD) WITH PROPOFOL;  Surgeon: Lin Landsman, MD;  Location: [place];  Service: Endoscopy;  Laterality: N/A;  . KNEE ARTHROSCOPY WITH MENISCAL REPAIR Right 07/24/2019   Procedure: KNEE ARTHROSCOPY WITH PARTIAL MENISECTOMY CHONDROPLASTY, PATELLOFEMORAL COMPARTMENT, Plica Removal;  Surgeon: Leim Fabry, MD;  Location: [place];  Service: Orthopedics;  Laterality: Right;  sleep apnea  . NASAL  SINUS SURGERY    . POLYPECTOMY  10/05/2019   Procedure: POLYPECTOMY;  Surgeon: Lin Landsman, MD;  Location: [place];  Service: Endoscopy;;    There were no vitals filed for this visit.  Subjective Assessment - 01/01/20 0824    Subjective   Sharon Barnes presents for OT Rxv visit 15/ 36 to address BLE lymphedema. Pt presents with new OTS Juzo SOFT  ccl 2 knee high compression stockings in place. Pt expresses concern that stockins may be too long.    Pertinent History  LE relevant PMH: OSA, cpap nightly. has not been calibrated in > 2 yrs. R meniscus tear and repair. obesity,    Limitations  impaired functional ambulation and mobility;  impaired standing balance;  decreased LB muscle strength, decreased LE AROM;   chronic, progressive BLE and L arm swelling and associated pain; limited with basic and instrumental ADLs requiring standing and walking > 15 mintes; difficulty fitting street shoes and LB clothing and UB clothing, difficulty lifting, reaching, carrying to perform self care, home management and productive/ work related activities    Repetition  Increases Symptoms    Special Tests  + stemer BLE and L hand    Pain Onset  --   pain onset estimated feb 2017                  OT Treatments/Exercises (OP) - 01/01/20 0001      ADLs  ADL Education Given  Yes      Manual Therapy   Manual Therapy  Edema management;Compression Bandaging;Manual Lymphatic Drainage (MLD)    Edema Management  Compression stocking fitting and assessment    Manual Lymphatic Drainage (MLD)  RLE as established    Compression Bandaging  Wrap LLE using gradient compression techniques to the knee with 8, 10, and 12 cm short stretch bandages over single layer of 0.4 cm Rosidal foam and stockinett.             OT Education - 01/01/20 0858    Education Details  Pt edu for donning, doffing and fitting compression stockings using assistive devices    Person(s) Educated  Patient     Methods  Explanation;Demonstration    Comprehension  Verbalized understanding;Returned demonstration          OT Long Term Goals - 01/01/20 0900      OT LONG TERM GOAL #1   Title  Pt will be able to verbalize signs and symptoms of cellulitis infection and identify 4 common lymphedema precautions using printed resource for reference (modified independence) with Max assist from spouse to limit LE progression over time.    Baseline  Max A    Time  4    Period  Days    Status  Achieved      OT LONG TERM GOAL #2   Title  to ensure optimal limb volume reduction, to limit infection risk and to limit LE progression.Pt able to apply short stretch, multi-layered compression wraps using correct gradient techniques with modified independence (extra time) after skilled training for lymphedema self care to achieve limb volume reduction and limit progression of lymphedema.    Baseline  dependent    Time  4    Period  Days    Status  Achieved      OT LONG TERM GOAL #3   Title  Pt to achieve at least 10% RLE limb volume reduction in BLE below the knees, and 10%limb volume reduction in LUE during Intensive Phase CDT to improve safe functional mobility and ambulation, to improve functional performance of basic and instrumental ADLs, to limit infection risk and chronic LE progression.    Baseline  Max A    Time  12    Period  Weeks    Status  Partially Met      OT LONG TERM GOAL #4   Title  Pt will achieve and sustain at least 85%  compliance with daily LE self-care home program (skin care, lymphatic pumping therex, compression and simple self-MLD) during Intensive Phase CDT to limit limb swelling, reduce infection risk and limit LE progression.    Baseline  Max A    Time  12    Period  Weeks    Status  Achieved      OT LONG TERM GOAL #5   Title  Once issued Pt will be able to don and doff appropriate compression garments and/ or devices using correct techniques and assistive devices with modified  independence and extra time  for optimal LE management to limit progression over time.    Baseline  Max A    Time  12    Period  Weeks    Status  Achieved      OT LONG TERM GOAL #6   Title  During self-management phase of CDT Pt will retain limb volume reductions achieved during Intensive Phase CDT with no more than 3% volume increase to limit LE  progression and further functional decline.    Baseline  Max A    Time  6    Period  Months    Status  On-going            Plan - 01/01/20 0900    Clinical Impression Statement  OTS Juzo SOFT (2002) knee length, SHORT, ccl 2 (30-40 mmHg) compression stockings appear to fit and function well. Pt is able to don and doff correctly with modified independence  using friction gloves after skilled training. Commenced MLD to LLE today and applied 3 layer gradient wrap. We'll assess volumetrics in 2 weeks and consider decreasing visitfrequency at that point if LE is stable.       Patient will benefit from skilled therapeutic intervention in order to improve the following deficits and impairments:           Visit Diagnosis: Lymphedema, not elsewhere classified    Problem List Patient Active Problem List   Diagnosis Date Noted  . Hypertensive disorder 12/13/2019  . Neuropathy 12/13/2019  . Chronic GERD   . Migraine headache 02/17/2017  . Swelling of limb 06/28/2016  . Lymphedema 06/28/2016  . Pain in limb 06/28/2016  . Hyperglycemia 02/03/2015  . DVT of lower limb, acute ([place]) 01/08/2014  . High risk medication use 01/08/2014  . Hyperlipidemia 01/08/2014  . OSA (obstructive sleep apnea) 01/08/2014    Andrey Spearman, MS, OTR/L, [facility] 01/01/20 9:04 AM   [place] MAIN [facility] SERVICES [address] [place], [place], [address] Phone: [phone]   Fax:  [phone]  Name: Sharon Barnes MRN: [id number] Date of Birth: July 08, 1966

## 2020-01-02 DIAGNOSIS — I10 Essential (primary) hypertension: Secondary | ICD-10-CM | POA: Insufficient documentation

## 2020-01-03 ENCOUNTER — Ambulatory Visit: Payer: BC Managed Care – PPO | Admitting: Occupational Therapy

## 2020-01-08 ENCOUNTER — Ambulatory Visit: Payer: BC Managed Care – PPO | Admitting: Occupational Therapy

## 2020-01-10 ENCOUNTER — Ambulatory Visit: Payer: BC Managed Care – PPO | Admitting: Occupational Therapy

## 2020-01-10 ENCOUNTER — Other Ambulatory Visit: Payer: Self-pay

## 2020-01-10 DIAGNOSIS — I890 Lymphedema, not elsewhere classified: Secondary | ICD-10-CM

## 2020-01-10 NOTE — Therapy (Signed)
[place] MAIN [facility] SERVICES [address] [place], [place], [address] Phone: [phone]   Fax:  [phone]  Occupational Therapy Treatment  Patient Details  Name: Sharon Barnes MRN: [id number] Date of Birth: 04-02-1966 Referring Provider (OT): Eulogio Ditch, NP   Encounter Date: 01/10/2020  OT End of Session - 01/10/20 0912    Visit Number  16    Number of Visits  36    Date for OT Re-Evaluation  01/23/20    OT Start Time  0802    OT Stop Time  0853    OT Time Calculation (min)  51 min    Equipment Utilized During Treatment  friction gloves    Activity Tolerance  Patient tolerated treatment well;No increased pain    Behavior During Therapy  WFL for tasks assessed/performed       Past Medical History:  Diagnosis Date  . Arthritis    knees  . DVT (deep venous thrombosis) ([place]) 07/15/2014   Right leg  . GERD (gastroesophageal reflux disease)   . Hyperlipidemia   . Lymphedema   . Motion sickness    circular motion  . Numbness of feet   . PONV (postoperative nausea and vomiting)    after hysterectomy  . Sleep apnea    CPAP    Past Surgical History:  Procedure Laterality Date  . ABDOMINAL HYSTERECTOMY    . CHOLECYSTECTOMY    . COLONOSCOPY WITH PROPOFOL N/A 10/05/2019   Procedure: COLONOSCOPY WITH PROPOFOL;  Surgeon: Lin Landsman, MD;  Location: [place];  Service: Endoscopy;  Laterality: N/A;  . ESOPHAGOGASTRODUODENOSCOPY (EGD) WITH PROPOFOL N/A 10/05/2019   Procedure: ESOPHAGOGASTRODUODENOSCOPY (EGD) WITH PROPOFOL;  Surgeon: Lin Landsman, MD;  Location: [place];  Service: Endoscopy;  Laterality: N/A;  . KNEE ARTHROSCOPY WITH MENISCAL REPAIR Right 07/24/2019   Procedure: KNEE ARTHROSCOPY WITH PARTIAL MENISECTOMY CHONDROPLASTY, PATELLOFEMORAL COMPARTMENT, Plica Removal;  Surgeon: Leim Fabry, MD;  Location: [place];  Service: Orthopedics;  Laterality: Right;  sleep apnea  . NASAL  SINUS SURGERY    . POLYPECTOMY  10/05/2019   Procedure: POLYPECTOMY;  Surgeon: Lin Landsman, MD;  Location: [place];  Service: Endoscopy;;    There were no vitals filed for this visit.  Subjective Assessment - 01/10/20 0858    Subjective   [name] presents for OT Rxv visit 16/ 36 to address BLE lymphedema. Pt presents with new OTS Juzo SOFT  ccl 2 knee high compression stockings in place. Pt reports she saw Dr Haynes Kerns and had venous studies recently, which showed no evidence of venous insufficiency or DVT bilaterally. Pt reports she has an upcoming consult with [facility] to determine if  lymphatic Rx procedure/s may be an option. Pt reports she continues to diligently use daytime compression knee highs daily as perscribed.    Pertinent History  LE relevant PMH: OSA, cpap nightly. has not been calibrated in > 2 yrs. R meniscus tear and repair. obesity,    Limitations  impaired functional ambulation and mobility;  impaired standing balance;  decreased LB muscle strength, decreased LE AROM;   chronic, progressive BLE and L arm swelling and associated pain; limited with basic and instrumental ADLs requiring standing and walking > 15 mintes; difficulty fitting street shoes and LB clothing and UB clothing, difficulty lifting, reaching, carrying to perform self care, home management and productive/ work related activities    Repetition  Increases Symptoms    Special Tests  + stemer  BLE and L hand    Pain Onset  --   pain onset estimated feb 2017                  OT Treatments/Exercises (OP) - 01/10/20 0001      ADLs   ADL Education Given  Yes      Manual Therapy   Manual Therapy  Edema management;Manual Lymphatic Drainage (MLD);Compression Bandaging    Manual Lymphatic Drainage (MLD)  LLE as established    Compression Bandaging  Wrap LLE using gradient compression techniques to the knee with 8, 10, and 12 cm short stretch bandages over single layer of 0.4 cm  Rosidal foam and stockinett.             OT Education - 01/10/20 1232    Education Details  Pt edu re lymphatic imaging techniques including lymphocintigraphy and IC green technique    Person(s) Educated  Patient    Methods  Explanation;Demonstration    Comprehension  Verbalized understanding;Returned demonstration          OT Long Term Goals - 01/01/20 0900      OT LONG TERM GOAL #1   Title  Pt will be able to verbalize signs and symptoms of cellulitis infection and identify 4 common lymphedema precautions using printed resource for reference (modified independence) with Max assist from spouse to limit LE progression over time.    Baseline  Max A    Time  4    Period  Days    Status  Achieved      OT LONG TERM GOAL #2   Title  to ensure optimal limb volume reduction, to limit infection risk and to limit LE progression.Pt able to apply short stretch, multi-layered compression wraps using correct gradient techniques with modified independence (extra time) after skilled training for lymphedema self care to achieve limb volume reduction and limit progression of lymphedema.    Baseline  dependent    Time  4    Period  Days    Status  Achieved      OT LONG TERM GOAL #3   Title  Pt to achieve at least 10% RLE limb volume reduction in BLE below the knees, and 10%limb volume reduction in LUE during Intensive Phase CDT to improve safe functional mobility and ambulation, to improve functional performance of basic and instrumental ADLs, to limit infection risk and chronic LE progression.    Baseline  Max A    Time  12    Period  Weeks    Status  Partially Met      OT LONG TERM GOAL #4   Title  Pt will achieve and sustain at least 85%  compliance with daily LE self-care home program (skin care, lymphatic pumping therex, compression and simple self-MLD) during Intensive Phase CDT to limit limb swelling, reduce infection risk and limit LE progression.    Baseline  Max A    Time  12     Period  Weeks    Status  Achieved      OT LONG TERM GOAL #5   Title  Once issued Pt will be able to don and doff appropriate compression garments and/ or devices using correct techniques and assistive devices with modified independence and extra time  for optimal LE management to limit progression over time.    Baseline  Max A    Time  12    Period  Weeks    Status  Achieved  OT LONG TERM GOAL #6   Title  During self-management phase of CDT Pt will retain limb volume reductions achieved during Intensive Phase CDT with no more than 3% volume increase to limit LE progression and further functional decline.    Baseline  Max A    Time  6    Period  Months    Status  On-going            Plan - 01/10/20 1234    Clinical Impression Statement  Pt tolerated MLD , skin care and LLE compression wrapping without increased pain or shortness of breath. OTS compression garments are containing swelling well. Fit appears correct. Cont 1 x weekly as per POC.       Patient will benefit from skilled therapeutic intervention in order to improve the following deficits and impairments:           Visit Diagnosis: Lymphedema, not elsewhere classified    Problem List Patient Active Problem List   Diagnosis Date Noted  . Hypertensive disorder 12/13/2019  . Neuropathy 12/13/2019  . Chronic GERD   . Migraine headache 02/17/2017  . Swelling of limb 06/28/2016  . Lymphedema 06/28/2016  . Pain in limb 06/28/2016  . Hyperglycemia 02/03/2015  . DVT of lower limb, acute ([place]) 01/08/2014  . High risk medication use 01/08/2014  . Hyperlipidemia 01/08/2014  . OSA (obstructive sleep apnea) 01/08/2014    Andrey Spearman, MS, OTR/L, [facility] 01/10/20 12:38 PM   [place] MAIN [facility] SERVICES [address] [place], [place], [address] Phone: [phone]   Fax:  [phone]  Name: Sharon Barnes MRN: [id number] Date of Birth: September 05, 1965

## 2020-01-29 ENCOUNTER — Other Ambulatory Visit: Payer: Self-pay

## 2020-01-29 ENCOUNTER — Ambulatory Visit: Payer: BC Managed Care – PPO | Attending: Nurse Practitioner | Admitting: Occupational Therapy

## 2020-01-29 DIAGNOSIS — I890 Lymphedema, not elsewhere classified: Secondary | ICD-10-CM | POA: Diagnosis present

## 2020-01-29 NOTE — Therapy (Signed)
[place] MAIN [facility] SERVICES [address] [place], [place], [address] Phone: [phone]   Fax:  [phone]  Occupational Therapy Treatment  Patient Details  Name: Sharon Barnes MRN: [id number] Date of Birth: 07/29/1966 Referring Provider (OT): Eulogio Ditch, NP   Encounter Date: 01/29/2020  OT End of Session - 01/29/20 0946    Visit Number  17    Number of Visits  36    Date for OT Re-Evaluation  01/23/20    OT Start Time  0804    OT Stop Time  0918    OT Time Calculation (min)  74 min    Equipment Utilized During Treatment  friction gloves    Activity Tolerance  Patient tolerated treatment well;No increased pain    Behavior During Therapy  WFL for tasks assessed/performed       Past Medical History:  Diagnosis Date  . Arthritis    knees  . DVT (deep venous thrombosis) ([place]) 07/15/2014   Right leg  . GERD (gastroesophageal reflux disease)   . Hyperlipidemia   . Lymphedema   . Motion sickness    circular motion  . Numbness of feet   . PONV (postoperative nausea and vomiting)    after hysterectomy  . Sleep apnea    CPAP    Past Surgical History:  Procedure Laterality Date  . ABDOMINAL HYSTERECTOMY    . CHOLECYSTECTOMY    . COLONOSCOPY WITH PROPOFOL N/A 10/05/2019   Procedure: COLONOSCOPY WITH PROPOFOL;  Surgeon: Lin Landsman, MD;  Location: [place];  Service: Endoscopy;  Laterality: N/A;  . ESOPHAGOGASTRODUODENOSCOPY (EGD) WITH PROPOFOL N/A 10/05/2019   Procedure: ESOPHAGOGASTRODUODENOSCOPY (EGD) WITH PROPOFOL;  Surgeon: Lin Landsman, MD;  Location: [place];  Service: Endoscopy;  Laterality: N/A;  . KNEE ARTHROSCOPY WITH MENISCAL REPAIR Right 07/24/2019   Procedure: KNEE ARTHROSCOPY WITH PARTIAL MENISECTOMY CHONDROPLASTY, PATELLOFEMORAL COMPARTMENT, Plica Removal;  Surgeon: Leim Fabry, MD;  Location: [place];  Service: Orthopedics;  Laterality: Right;  sleep apnea  . NASAL  SINUS SURGERY    . POLYPECTOMY  10/05/2019   Procedure: POLYPECTOMY;  Surgeon: Lin Landsman, MD;  Location: [place];  Service: Endoscopy;;    There were no vitals filed for this visit.  Subjective Assessment - 01/29/20 0930    Subjective   Sharon Barnes presents for OT Rxv visit 17/ 36 to address BLE lymphedema. Pt presents with ccl 2 compression knee high on the RLE and 3 layer gradient comrpession wrap below the knee on the L. Pt reports when removing OTS compression stocking at the end of the day anterior ankle and adjacent skin on the foot is reddened. Pt also expresses concern that stockings are causing worsening of the fat pad at R lateral malleolus ( this is absent on the LLE) . Pt reports she is off for the summer since school    year is complete. She requests guidance for accessing DME vendor who can assist with obtaining custom compression garments going forward since she has met OOP insurance costs.    Pertinent History  LE relevant PMH: OSA, cpap nightly. has not been calibrated in > 2 yrs. R meniscus tear and repair. obesity,    Limitations  impaired functional ambulation and mobility;  impaired standing balance;  decreased LB muscle strength, decreased LE AROM;   chronic, progressive BLE and L arm swelling and associated pain; limited with basic and instrumental ADLs requiring standing and walking > 15 mintes; difficulty  fitting street shoes and LB clothing and UB clothing, difficulty lifting, reaching, carrying to perform self care, home management and productive/ work related activities    Repetition  Increases Symptoms    Special Tests  + stemer BLE and L hand    Currently in Pain?  No/denies    Pain Onset  --   pain onset estimated feb 2017                  OT Treatments/Exercises (OP) - 01/29/20 0001      ADLs   ADL Education Given  Yes      Manual Therapy   Manual Therapy  Edema management;Manual Lymphatic Drainage (MLD);Compression  Bandaging    Manual Lymphatic Drainage (MLD)  LLE as established    Compression Bandaging  Wrap LLE using gradient compression techniques to the knee with 8, 10, and 12 cm short stretch bandages over single layer of 0.4 cm Rosidal foam and stockinett.             OT Education - 01/29/20 0943    Education Details  Pt edu re malleolar fat pad anatomy and fatty fibrosis accumulation associated w/ long standing LE. Pt edu re differences in OOS and custom compression garments , flat vs circular knit, medical vs non-medical grade compression , and DME process. Pt would like to proceed.    Person(s) Educated  Patient    Methods  Explanation;Demonstration    Comprehension  Verbalized understanding;Returned demonstration          OT Long Term Goals - 01/01/20 0900      OT LONG TERM GOAL #1   Title  Pt will be able to verbalize signs and symptoms of cellulitis infection and identify 4 common lymphedema precautions using printed resource for reference (modified independence) with Max assist from spouse to limit LE progression over time.    Baseline  Max A    Time  4    Period  Days    Status  Achieved      OT LONG TERM GOAL #2   Title  to ensure optimal limb volume reduction, to limit infection risk and to limit LE progression.Pt able to apply short stretch, multi-layered compression wraps using correct gradient techniques with modified independence (extra time) after skilled training for lymphedema self care to achieve limb volume reduction and limit progression of lymphedema.    Baseline  dependent    Time  4    Period  Days    Status  Achieved      OT LONG TERM GOAL #3   Title  Pt to achieve at least 10% RLE limb volume reduction in BLE below the knees, and 10%limb volume reduction in LUE during Intensive Phase CDT to improve safe functional mobility and ambulation, to improve functional performance of basic and instrumental ADLs, to limit infection risk and chronic LE progression.     Baseline  Max A    Time  12    Period  Weeks    Status  Partially Met      OT LONG TERM GOAL #4   Title  Pt will achieve and sustain at least 85%  compliance with daily LE self-care home program (skin care, lymphatic pumping therex, compression and simple self-MLD) during Intensive Phase CDT to limit limb swelling, reduce infection risk and limit LE progression.    Baseline  Max A    Time  12    Period  Weeks    Status  Achieved      OT LONG TERM GOAL #5   Title  Once issued Pt will be able to don and doff appropriate compression garments and/ or devices using correct techniques and assistive devices with modified independence and extra time  for optimal LE management to limit progression over time.    Baseline  Max A    Time  12    Period  Weeks    Status  Achieved      OT LONG TERM GOAL #6   Title  During self-management phase of CDT Pt will retain limb volume reductions achieved during Intensive Phase CDT with no more than 3% volume increase to limit LE progression and further functional decline.    Baseline  Max A    Time  6    Period  Months    Status  On-going            Plan - 01/29/20 0947    Clinical Impression Statement  BLE LE is very well managed during visit interval. Swelling is well controlled and skin condition is excellent. Pt remains diligent and compliant w/ LE self care. Provided Pt edu throughout session while providing MLD re OTS vs custom compression garments, likes and differences, flat vs circular knit, etc, and provided vendor contact to make use of insurance benefits. Discussed clinical support going fwd as Pt is transitioning from Intensive to Self-Management CDT. Pt tolerated LLE MLD without difficulty. Fat pad at R lateral malleolus is mildly more pronounced than on L, but this formation is minimal. This OT has not noticed any increase in fatty fibrosis formation over the ourse of treatment, but suspect the fat pad is more noticable as swelling has  decreased over time. Faxed damographics, referral and garment recommendations to DME vendoir at [facility]. Pt returns next week. Cont as per POC.       Patient will benefit from skilled therapeutic intervention in order to improve the following deficits and impairments:           Visit Diagnosis: Lymphedema, not elsewhere classified    Problem List Patient Active Problem List   Diagnosis Date Noted  . Hypertensive disorder 12/13/2019  . Neuropathy 12/13/2019  . Chronic GERD   . Migraine headache 02/17/2017  . Swelling of limb 06/28/2016  . Lymphedema 06/28/2016  . Pain in limb 06/28/2016  . Hyperglycemia 02/03/2015  . DVT of lower limb, acute ([place]) 01/08/2014  . High risk medication use 01/08/2014  . Hyperlipidemia 01/08/2014  . OSA (obstructive sleep apnea) 01/08/2014   Andrey Spearman, MS, OTR/L, [facility] 01/29/20 9:55 AM   [place] MAIN [facility] SERVICES [address] [place], [place], [address] Phone: [phone]   Fax:  [phone]  Name: Sharon Barnes MRN: [id number] Date of Birth: 11/29/1965

## 2020-01-30 DIAGNOSIS — M2550 Pain in unspecified joint: Secondary | ICD-10-CM | POA: Insufficient documentation

## 2020-01-30 DIAGNOSIS — E6609 Other obesity due to excess calories: Secondary | ICD-10-CM | POA: Insufficient documentation

## 2020-02-05 ENCOUNTER — Ambulatory Visit: Payer: BC Managed Care – PPO | Admitting: Occupational Therapy

## 2020-02-05 ENCOUNTER — Other Ambulatory Visit: Payer: Self-pay

## 2020-02-05 DIAGNOSIS — I890 Lymphedema, not elsewhere classified: Secondary | ICD-10-CM

## 2020-02-05 NOTE — Therapy (Signed)
[place] MAIN [facility] SERVICES [address] [place], [place], [address] Phone: [phone]   Fax:  [phone]  Occupational Therapy Treatment  Patient Details  Name: Sharon Barnes MRN: [id number] Date of Birth: 19-Sep-1965 Referring Provider (OT): Eulogio Ditch, NP   Encounter Date: 02/05/2020   OT End of Session - 02/05/20 1208    Visit Number 18    Number of Visits 36    Date for OT Re-Evaluation 01/23/20    OT Start Time 0822    OT Stop Time 0913    OT Time Calculation (min) 51 min    Equipment Utilized During Treatment friction gloves    Activity Tolerance Patient tolerated treatment well;No increased pain    Behavior During Therapy WFL for tasks assessed/performed           Past Medical History:  Diagnosis Date  . Arthritis    knees  . DVT (deep venous thrombosis) ([place]) 07/15/2014   Right leg  . GERD (gastroesophageal reflux disease)   . Hyperlipidemia   . Lymphedema   . Motion sickness    circular motion  . Numbness of feet   . PONV (postoperative nausea and vomiting)    after hysterectomy  . Sleep apnea    CPAP    Past Surgical History:  Procedure Laterality Date  . ABDOMINAL HYSTERECTOMY    . CHOLECYSTECTOMY    . COLONOSCOPY WITH PROPOFOL N/A 10/05/2019   Procedure: COLONOSCOPY WITH PROPOFOL;  Surgeon: Lin Landsman, MD;  Location: [place];  Service: Endoscopy;  Laterality: N/A;  . ESOPHAGOGASTRODUODENOSCOPY (EGD) WITH PROPOFOL N/A 10/05/2019   Procedure: ESOPHAGOGASTRODUODENOSCOPY (EGD) WITH PROPOFOL;  Surgeon: Lin Landsman, MD;  Location: [place];  Service: Endoscopy;  Laterality: N/A;  . KNEE ARTHROSCOPY WITH MENISCAL REPAIR Right 07/24/2019   Procedure: KNEE ARTHROSCOPY WITH PARTIAL MENISECTOMY CHONDROPLASTY, PATELLOFEMORAL COMPARTMENT, Plica Removal;  Surgeon: Leim Fabry, MD;  Location: [place];  Service: Orthopedics;  Laterality: Right;  sleep apnea  . NASAL  SINUS SURGERY    . POLYPECTOMY  10/05/2019   Procedure: POLYPECTOMY;  Surgeon: Lin Landsman, MD;  Location: [place];  Service: Endoscopy;;    There were no vitals filed for this visit.   Subjective Assessment - 02/05/20 1204    Subjective  Sharon Barnes presents for OT Rxv visit 18/ 36 to address BLE lymphedema. Pt presents for OT session 22 minutes late for 60 minute treatment.    Pertinent History LE relevant PMH: OSA, cpap nightly. has not been calibrated in > 2 yrs. R meniscus tear and repair. obesity,    Limitations impaired functional ambulation and mobility;  impaired standing balance;  decreased LB muscle strength, decreased LE AROM;   chronic, progressive BLE and L arm swelling and associated pain; limited with basic and instrumental ADLs requiring standing and walking > 15 mintes; difficulty fitting street shoes and LB clothing and UB clothing, difficulty lifting, reaching, carrying to perform self care, home management and productive/ work related activities    Repetition Increases Symptoms    Special Tests + stemer BLE and L hand    Pain Onset --   pain onset estimated feb 2017                       OT Treatments/Exercises (OP) - 02/05/20 0001      ADLs   ADL Education Given Yes      Manual Therapy   Manual Therapy  Edema management;Manual Lymphatic Drainage (MLD);Compression Bandaging    Manual Lymphatic Drainage (MLD) LLE as established    Compression Bandaging Wrap LLE using gradient compression techniques to the knee with 8, 10, and 12 cm short stretch bandages over single layer of 0.4 cm Rosidal foam and stockinett.                  OT Education - 02/05/20 1207    Education Details Throughout session provided Pt education for garment fitting process.    Person(s) Educated Patient    Methods Explanation;Demonstration    Comprehension Verbalized understanding;Returned demonstration               OT Long Term Goals -  01/01/20 0900      OT LONG TERM GOAL #1   Title Pt will be able to verbalize signs and symptoms of cellulitis infection and identify 4 common lymphedema precautions using printed resource for reference (modified independence) with Max assist from spouse to limit LE progression over time.    Baseline Max A    Time 4    Period Days    Status Achieved      OT LONG TERM GOAL #2   Title to ensure optimal limb volume reduction, to limit infection risk and to limit LE progression.Pt able to apply short stretch, multi-layered compression wraps using correct gradient techniques with modified independence (extra time) after skilled training for lymphedema self care to achieve limb volume reduction and limit progression of lymphedema.    Baseline dependent    Time 4    Period Days    Status Achieved      OT LONG TERM GOAL #3   Title Pt to achieve at least 10% RLE limb volume reduction in BLE below the knees, and 10%limb volume reduction in LUE during Intensive Phase CDT to improve safe functional mobility and ambulation, to improve functional performance of basic and instrumental ADLs, to limit infection risk and chronic LE progression.    Baseline Max A    Time 12    Period Weeks    Status Partially Met      OT LONG TERM GOAL #4   Title Pt will achieve and sustain at least 85%  compliance with daily LE self-care home program (skin care, lymphatic pumping therex, compression and simple self-MLD) during Intensive Phase CDT to limit limb swelling, reduce infection risk and limit LE progression.    Baseline Max A    Time 12    Period Weeks    Status Achieved      OT LONG TERM GOAL #5   Title Once issued Pt will be able to don and doff appropriate compression garments and/ or devices using correct techniques and assistive devices with modified independence and extra time  for optimal LE management to limit progression over time.    Baseline Max A    Time 12    Period Weeks    Status Achieved        OT LONG TERM GOAL #6   Title During self-management phase of CDT Pt will retain limb volume reductions achieved during Intensive Phase CDT with no more than 3% volume increase to limit LE progression and further functional decline.    Baseline Max A    Time 6    Period Months    Status On-going                 Plan - 02/05/20 1211    Clinical Impression Statement  Pt tolerated MLD MLD and compression wraps to LLE without increased pain today. Pt educated throughout session on obtaining custom DME from a remote vendor. Provided medical records release form.           Patient will benefit from skilled therapeutic intervention in order to improve the following deficits and impairments:           Visit Diagnosis: Lymphedema, not elsewhere classified    Problem List Patient Active Problem List   Diagnosis Date Noted  . Hypertensive disorder 12/13/2019  . Neuropathy 12/13/2019  . Chronic GERD   . Migraine headache 02/17/2017  . Swelling of limb 06/28/2016  . Lymphedema 06/28/2016  . Pain in limb 06/28/2016  . Hyperglycemia 02/03/2015  . DVT of lower limb, acute ([place]) 01/08/2014  . High risk medication use 01/08/2014  . Hyperlipidemia 01/08/2014  . OSA (obstructive sleep apnea) 01/08/2014   Andrey Spearman, MS, OTR/L, [facility] 02/05/20 12:20 PM   [place] MAIN [facility] SERVICES [address] [place], [place], [address] Phone: [phone]   Fax:  [phone]  Name: Sharon Barnes MRN: [id number] Date of Birth: Feb 24, 1966

## 2020-02-12 ENCOUNTER — Ambulatory Visit: Payer: BC Managed Care – PPO | Admitting: Occupational Therapy

## 2020-02-12 ENCOUNTER — Other Ambulatory Visit: Payer: Self-pay

## 2020-02-12 DIAGNOSIS — I890 Lymphedema, not elsewhere classified: Secondary | ICD-10-CM | POA: Diagnosis not present

## 2020-02-12 NOTE — Therapy (Signed)
[place] MAIN [facility] SERVICES [address] [place], [place], [address] Phone: [phone]   Fax:  [phone]  Occupational Therapy Treatment Note and Progress Report: Lymphedema Care  Patient Details  Name: Sharon Barnes MRN: [id number] Date of Birth: Aug 11, 1966 Referring Provider (OT): Eulogio Ditch, NP   Encounter Date: 02/12/2020   OT End of Session - 02/12/20 1253    Visit Number 19    Number of Visits 36    Date for OT Re-Evaluation 05/12/20    OT Start Time 0805    OT Stop Time 0913    OT Time Calculation (min) 68 min    Equipment Utilized During Treatment friction gloves    Activity Tolerance Patient tolerated treatment well;No increased pain    Behavior During Therapy WFL for tasks assessed/performed           Past Medical History:  Diagnosis Date  . Arthritis    knees  . DVT (deep venous thrombosis) ([place]) 07/15/2014   Right leg  . GERD (gastroesophageal reflux disease)   . Hyperlipidemia   . Lymphedema   . Motion sickness    circular motion  . Numbness of feet   . PONV (postoperative nausea and vomiting)    after hysterectomy  . Sleep apnea    CPAP    Past Surgical History:  Procedure Laterality Date  . ABDOMINAL HYSTERECTOMY    . CHOLECYSTECTOMY    . COLONOSCOPY WITH PROPOFOL N/A 10/05/2019   Procedure: COLONOSCOPY WITH PROPOFOL;  Surgeon: Lin Landsman, MD;  Location: [place];  Service: Endoscopy;  Laterality: N/A;  . ESOPHAGOGASTRODUODENOSCOPY (EGD) WITH PROPOFOL N/A 10/05/2019   Procedure: ESOPHAGOGASTRODUODENOSCOPY (EGD) WITH PROPOFOL;  Surgeon: Lin Landsman, MD;  Location: [place];  Service: Endoscopy;  Laterality: N/A;  . KNEE ARTHROSCOPY WITH MENISCAL REPAIR Right 07/24/2019   Procedure: KNEE ARTHROSCOPY WITH PARTIAL MENISECTOMY CHONDROPLASTY, PATELLOFEMORAL COMPARTMENT, Plica Removal;  Surgeon: Leim Fabry, MD;  Location: [place];  Service: Orthopedics;   Laterality: Right;  sleep apnea  . NASAL SINUS SURGERY    . POLYPECTOMY  10/05/2019   Procedure: POLYPECTOMY;  Surgeon: Lin Landsman, MD;  Location: [place];  Service: Endoscopy;;    There were no vitals filed for this visit.   Subjective Assessment - 02/12/20 2637    Subjective  Sharon Barnes presents for OT Rxv visit 19/ 36 to address BLE lymphedema. Pt presents for OT session 22 minutes late for 60 minute treatment.    Pertinent History LE relevant PMH: OSA, cpap nightly. has not been calibrated in > 2 yrs. R meniscus tear and repair. obesity,    Limitations impaired functional ambulation and mobility;  impaired standing balance;  decreased LB muscle strength, decreased LE AROM;   chronic, progressive BLE and L arm swelling and associated pain; limited with basic and instrumental ADLs requiring standing and walking > 15 mintes; difficulty fitting street shoes and LB clothing and UB clothing, difficulty lifting, reaching, carrying to perform self care, home management and productive/ work related activities    Repetition Increases Symptoms    Special Tests + stemer BLE and L hand    Pain Onset --   pain onset estimated feb 2017              LYMPHEDEMA/ONCOLOGY QUESTIONNAIRE - 02/12/20 0001      Right Lower Extremity Lymphedema   Other RLE ankle to tibial tuberosity (A-D) limb volume = 4201.05 ml    Other  RLE A-D limb volume is decreased by 7.85% since commencing CDT on 11/02/19.    Other INITIAL RUE limb volume = 2588.65ml      Left Lower Extremity Lymphedema   Other LLE ankle to tibial tuberosity (A-D) limb volume 3748.74 ml.     Other LLE leg volume is decreased by 13.44% since commencing OT for CDT. 10% goal met.    Other INITIAL RUE limb volume = 2901.88 ml                   OT Treatments/Exercises (OP) - 02/12/20 0001      ADLs   ADL Education Given Yes      Manual Therapy   Manual Therapy Edema management;Manual Lymphatic Drainage  (MLD);Compression Bandaging    Manual therapy comments BLEand BUE  comparative limb volumetrics    Manual Lymphatic Drainage (MLD) LLE as established    Compression Bandaging Wrap LLE using gradient compression techniques to the knee with 8, 10, and 12 cm short stretch bandages over single layer of 0.4 cm Rosidal foam and stockinett.                  OT Education - 02/12/20 1245    Education Details iewed progress towards all goals. Reviewed plan for transition from Intensive t sekf-management phase of CDT. Reviewed LE precautions and cellulitis signs/ symptoms. Reviewed results of BUE limb volumetrics.    Person(s) Educated Patient    Methods Explanation;Demonstration    Comprehension Verbalized understanding;Returned demonstration               OT Long Term Goals - 02/12/20 0800      OT LONG TERM GOAL #1   Title Pt will be able to verbalize signs and symptoms of cellulitis infection and identify 4 common lymphedema precautions using printed resource for reference (modified independence) with Max assist from spouse to limit LE progression over time.    Baseline Max A    Time 4    Period Days    Status Achieved      OT LONG TERM GOAL #2   Title to ensure optimal limb volume reduction, to limit infection risk and to limit LE progression.Pt able to apply short stretch, multi-layered compression wraps using correct gradient techniques with modified independence (extra time) after skilled training for lymphedema self care to achieve limb volume reduction and limit progression of lymphedema.    Baseline dependent    Time 4    Period Days    Status Achieved      OT LONG TERM GOAL #3   Title Pt to achieve at least 10% RLE limb volume reduction in BLE below the knees, and 10%limb volume reduction in LUE during Intensive Phase CDT to improve safe functional mobility and ambulation, to improve functional performance of basic and instrumental ADLs, to limit infection risk and  chronic LE progression.    Baseline Max A    Time 12    Period Weeks    Status Partially Met   Met on L w/ 13.44% reduction, and nearly on the R w/ 7.85%      OT LONG TERM GOAL #4   Title Pt will achieve and sustain at least 85%  compliance with daily LE self-care home program (skin care, lymphatic pumping therex, compression and simple self-MLD) during Intensive Phase CDT to limit limb swelling, reduce infection risk and limit LE progression.    Baseline Max A    Time 12    Period  Weeks    Status Achieved      OT LONG TERM GOAL #5   Title Once issued Pt will be able to don and doff appropriate compression garments and/ or devices using correct techniques and assistive devices with modified independence and extra time  for optimal LE management to limit progression over time.    Baseline Max A    Time 12    Period Weeks    Status Achieved      OT LONG TERM GOAL #6   Title During self-management phase of CDT Pt will retain limb volume reductions achieved during Intensive Phase CDT with no more than 3% volume increase to limit LE progression and further functional decline.    Baseline Max A    Time 6    Period Months    Status On-going                 Plan - 02/12/20 1255    Clinical Impression Statement Pt completes Intensive Phase and transitions to Self-Management Phase CDT today. Limb volumetrics reveal Pt has achieved LLE leg reduction (A-D) of 13.44% since comencing OT for LE care on 11/02/19. 10% goal met got LLE. RLE leg volume (A-D) is decreased overall by 7.85%. While this value does not meet goal for volume reduction, it represents significant achievement below the knee.  Intitial BUE comparative limb volumetrics  today reveal a surprising 12.11% limb volume differential, L>R ( dominant). Pt has achieved all OT goals for LE self care and volume reduction except the LLE reduction goal. She has been fitted with ccl 2 ots Juzo compression knee highs and has custom Elvarex   SOFT knee highs in process. She is independent and compliant with LE self care home progream protocol. Pt agrees with plan to return for edu vor UE simple self-MLD and UE compression wrapping. . After these activities are completewe'll f/u in 6 months and PRN to support ongoing self care.           Patient will benefit from skilled therapeutic intervention in order to improve the following deficits and impairments:           Visit Diagnosis: Lymphedema, not elsewhere classified - Plan: Ot plan of care cert/re-cert    Problem List Patient Active Problem List   Diagnosis Date Noted  . Hypertensive disorder 12/13/2019  . Neuropathy 12/13/2019  . Chronic GERD   . Migraine headache 02/17/2017  . Swelling of limb 06/28/2016  . Lymphedema 06/28/2016  . Pain in limb 06/28/2016  . Hyperglycemia 02/03/2015  . DVT of lower limb, acute ([place]) 01/08/2014  . High risk medication use 01/08/2014  . Hyperlipidemia 01/08/2014  . OSA (obstructive sleep apnea) 01/08/2014    Andrey Spearman, MS, OTR/L, [facility] 02/12/20 1:05 PM  [place] MAIN [facility] SERVICES [address] [place], [place], [address] Phone: [phone]   Fax:  [phone]  Name: Sharon Barnes MRN: [id number] Date of Birth: 04/26/1966

## 2020-02-14 DIAGNOSIS — R768 Other specified abnormal immunological findings in serum: Secondary | ICD-10-CM | POA: Insufficient documentation

## 2020-02-21 ENCOUNTER — Ambulatory Visit: Payer: BC Managed Care – PPO | Attending: Nurse Practitioner | Admitting: Occupational Therapy

## 2020-02-21 ENCOUNTER — Other Ambulatory Visit: Payer: Self-pay

## 2020-02-21 DIAGNOSIS — I890 Lymphedema, not elsewhere classified: Secondary | ICD-10-CM | POA: Insufficient documentation

## 2020-02-21 NOTE — Therapy (Signed)
Sharon Barnes [facility] SERVICES [address] [place], [place], [address] Phone: [phone]   Fax:  [phone]  Occupational Therapy Treatment  Patient Details  Name: Sharon Barnes MRN: [id number] Date of Birth: [birth year] Referring Provider (OT): Eulogio Ditch, NP   Encounter Date: 02/21/2020   OT End of Session - 02/21/20 1530    Visit Number 20    Number of Visits 36    Date for OT Re-Evaluation 05/12/20    OT Start Time 0100    OT Stop Time 0158    OT Time Calculation (min) 58 min    Equipment Utilized During Treatment friction gloves    Activity Tolerance Patient tolerated treatment well;No increased pain    Behavior During Therapy WFL for tasks assessed/performed           Past Medical History:  Diagnosis Date  . Arthritis    knees  . DVT (deep venous thrombosis) ([place]) 07/15/2014   Right leg  . GERD (gastroesophageal reflux disease)   . Hyperlipidemia   . Lymphedema   . Motion sickness    circular motion  . Numbness of feet   . PONV (postoperative nausea and vomiting)    after hysterectomy  . Sleep apnea    CPAP    Past Surgical History:  Procedure Laterality Date  . ABDOMINAL HYSTERECTOMY    . CHOLECYSTECTOMY    . COLONOSCOPY WITH PROPOFOL N/A 10/05/2019   Procedure: COLONOSCOPY WITH PROPOFOL;  Surgeon: Lin Landsman, MD;  Location: [place];  Service: Endoscopy;  Laterality: N/A;  . ESOPHAGOGASTRODUODENOSCOPY (EGD) WITH PROPOFOL N/A 10/05/2019   Procedure: ESOPHAGOGASTRODUODENOSCOPY (EGD) WITH PROPOFOL;  Surgeon: Lin Landsman, MD;  Location: [place];  Service: Endoscopy;  Laterality: N/A;  . KNEE ARTHROSCOPY WITH MENISCAL REPAIR Right 07/24/2019   Procedure: KNEE ARTHROSCOPY WITH PARTIAL MENISECTOMY CHONDROPLASTY, PATELLOFEMORAL COMPARTMENT, Plica Removal;  Surgeon: Leim Fabry, MD;  Location: [place];  Service: Orthopedics;  Laterality: Right;  sleep apnea  . NASAL  SINUS SURGERY    . POLYPECTOMY  10/05/2019   Procedure: POLYPECTOMY;  Surgeon: Lin Landsman, MD;  Location: [place];  Service: Endoscopy;;    There were no vitals filed for this visit.   Subjective Assessment - 02/21/20 1525    Subjective  [name] presents for OT Rxv visit 20/ 36 to address BLE and LUE lymphedema. Pt has no new complaints. She reports she has not yet received BLE custom compression garments.    Pertinent History LE relevant PMH: OSA, cpap nightly. has not been calibrated in > 2 yrs. R meniscus tear and repair. obesity,    Limitations impaired functional ambulation and mobility;  impaired standing balance;  decreased LB muscle strength, decreased LE AROM;   chronic, progressive BLE and L arm swelling and associated pain; limited with basic and instrumental ADLs requiring standing and walking > 15 mintes; difficulty fitting street shoes and LB clothing and UB clothing, difficulty lifting, reaching, carrying to perform self care, home management and productive/ work related activities    Repetition Increases Symptoms    Special Tests + stemer BLE and L hand    Pain Onset --   pain onset estimated feb 2017                       OT Treatments/Exercises (OP) - 02/21/20 0001      ADLs   ADL Education Given Yes  Manual Therapy   Manual Therapy Edema management;Compression Bandaging    Edema Management Pt edu for BUE LE management, including simple self MLD and gradient compression wrapping    Compression Bandaging Multilayer LUE compression wraps over rosidal foam and Isotoner glove                  OT Education - 02/21/20 1529    Education Details Pt edu for simple self MLD to LUE using functional ipsilateral axillary LN, and multilayer gradient compression wrapping. good return    Person(s) Educated Patient    Methods Explanation;Demonstration    Comprehension Verbalized understanding;Returned demonstration                OT Long Term Goals - 02/21/20 1500      OT LONG TERM GOAL #1   Title Pt will be able to verbalize signs and symptoms of cellulitis infection and identify 4 common lymphedema precautions using printed resource for reference (modified independence) with Max assist from spouse to limit LE progression over time.    Baseline Max A    Time 4    Period Days    Status Achieved      OT LONG TERM GOAL #2   Title to ensure optimal limb volume reduction, to limit infection risk and to limit LE progression.Pt able to apply short stretch, multi-layered compression wraps using correct gradient techniques with modified independence (extra time) after skilled training for lymphedema self care to achieve limb volume reduction and limit progression of lymphedema.    Baseline dependent    Time 4    Period Days    Status Achieved      OT LONG TERM GOAL #3   Title Pt to achieve at least 10% RLE limb volume reduction in BLE below the knees, and 10%limb volume reduction in LUE during Intensive Phase CDT to improve safe functional mobility and ambulation, to improve functional performance of basic and instrumental ADLs, to limit infection risk and chronic LE progression.    Baseline Max A    Time 12    Period Weeks    Status Partially Met   Met on L w/ 13.44% reduction, and nearly on the R w/ 7.85%      OT LONG TERM GOAL #4   Title Pt will achieve and sustain at least 85%  compliance with daily LE self-care home program (skin care, lymphatic pumping therex, compression and simple self-MLD) during Intensive Phase CDT to limit limb swelling, reduce infection risk and limit LE progression.    Baseline Max A    Time 12    Period Weeks    Status Achieved      OT LONG TERM GOAL #5   Title Once issued Pt will be able to don and doff appropriate compression garments and/ or devices using correct techniques and assistive devices with modified independence and extra time  for optimal LE management to limit  progression over time.    Baseline Max A    Time 12    Period Weeks    Status Achieved      OT LONG TERM GOAL #6   Title During self-management phase of CDT Pt will retain limb volume reductions achieved during Intensive Phase CDT with no more than 3% volume increase to limit LE progression and further functional decline.    Baseline Max A    Time 6    Period Months    Status On-going  Plan - 02/21/20 1531    Clinical Impression Statement See OT note for 02/12/20 for progress towards goals today as Pt has transitioned from Intensive Phase CDT for lymphedema care to the self-management phase. Emphasis of today's visit was on teaching multilayer gradient compression wrapping, lymphatic pumping ther ex and smple self-MLD for management of LUE swelling. Pt demonstrated exxcellent return and mastered all components. Pt will return gor f/u on LE in ~ 1 month. She's still awaiting delivery of custom compression garments.           Patient will benefit from skilled therapeutic intervention in order to improve the following deficits and impairments:           Visit Diagnosis: Lymphedema, not elsewhere classified    Problem List Patient Active Problem List   Diagnosis Date Noted  . Hypertensive disorder 12/13/2019  . Neuropathy 12/13/2019  . Chronic GERD   . Migraine headache 02/17/2017  . Swelling of limb 06/28/2016  . Lymphedema 06/28/2016  . Pain in limb 06/28/2016  . Hyperglycemia 02/03/2015  . DVT of lower limb, acute ([place]) 01/08/2014  . High risk medication use 01/08/2014  . Hyperlipidemia 01/08/2014  . OSA (obstructive sleep apnea) 01/08/2014    Andrey Spearman, MS, OTR/L, [facility] 02/21/20 3:35 PM  [place] [facility] SERVICES [address] [place], [place], [address] Phone: [phone]   Fax:  [phone]  Name: KERSTIE AGENT MRN: [id number] Date of Birth: May 26, 1966

## 2020-03-03 ENCOUNTER — Encounter (INDEPENDENT_AMBULATORY_CARE_PROVIDER_SITE_OTHER): Payer: Self-pay

## 2020-03-23 ENCOUNTER — Other Ambulatory Visit: Payer: Self-pay | Admitting: Gastroenterology

## 2020-03-25 ENCOUNTER — Ambulatory Visit: Payer: BC Managed Care – PPO | Admitting: Occupational Therapy

## 2020-04-02 ENCOUNTER — Other Ambulatory Visit: Payer: Self-pay

## 2020-04-02 ENCOUNTER — Ambulatory Visit: Payer: BC Managed Care – PPO | Attending: Nurse Practitioner | Admitting: Occupational Therapy

## 2020-04-02 DIAGNOSIS — I890 Lymphedema, not elsewhere classified: Secondary | ICD-10-CM | POA: Insufficient documentation

## 2020-04-02 NOTE — Therapy (Signed)
[place] MAIN [facility] SERVICES [address] [place], [place], [address] Phone: [phone]   Fax:  [phone]  Occupational Therapy Treatment  Patient Details  Name: Sharon Barnes MRN: [id number] Date of Birth: [birth year] Referring Provider (OT): Eulogio Ditch, NP   Encounter Date: 04/02/2020   OT End of Session - 04/02/20 1629    Visit Number 21    Number of Visits 36    Date for OT Re-Evaluation 05/12/20    OT Start Time 0300    OT Stop Time 0400    OT Time Calculation (min) 60 min    Equipment Utilized During Treatment friction gloves    Activity Tolerance Patient tolerated treatment well;No increased pain    Behavior During Therapy WFL for tasks assessed/performed           Past Medical History:  Diagnosis Date   Arthritis    knees   DVT (deep venous thrombosis) (HCC) 07/15/2014   Right leg   GERD (gastroesophageal reflux disease)    Hyperlipidemia    Lymphedema    Motion sickness    circular motion   Numbness of feet    PONV (postoperative nausea and vomiting)    after hysterectomy   Sleep apnea    CPAP    Past Surgical History:  Procedure Laterality Date   ABDOMINAL HYSTERECTOMY     CHOLECYSTECTOMY     COLONOSCOPY WITH PROPOFOL N/A 10/05/2019   Procedure: COLONOSCOPY WITH PROPOFOL;  Surgeon: Lin Landsman, MD;  Location: [place];  Service: Endoscopy;  Laterality: N/A;   ESOPHAGOGASTRODUODENOSCOPY (EGD) WITH PROPOFOL N/A 10/05/2019   Procedure: ESOPHAGOGASTRODUODENOSCOPY (EGD) WITH PROPOFOL;  Surgeon: Lin Landsman, MD;  Location: [place];  Service: Endoscopy;  Laterality: N/A;   KNEE ARTHROSCOPY WITH MENISCAL REPAIR Right 07/24/2019   Procedure: KNEE ARTHROSCOPY WITH PARTIAL MENISECTOMY CHONDROPLASTY, PATELLOFEMORAL COMPARTMENT, Plica Removal;  Surgeon: Leim Fabry, MD;  Location: [place];  Service: Orthopedics;  Laterality: Right;  sleep apnea   NASAL  SINUS SURGERY     POLYPECTOMY  10/05/2019   Procedure: POLYPECTOMY;  Surgeon: Lin Landsman, MD;  Location: [place];  Service: Endoscopy;;    There were no vitals filed for this visit.   Subjective Assessment - 04/02/20 1627    Subjective  Pt returns for repeat custom garment measurements because original measurements were lost. DME vendor states they were not received and OT is certasin she faxed them. To resolve the situation for the patient, we completed new measurements today.    Pertinent History LE relevant PMH: OSA, cpap nightly. has not been calibrated in > 2 yrs. R meniscus tear and repair. obesity,    Limitations impaired functional ambulation and mobility;  impaired standing balance;  decreased LB muscle strength, decreased LE AROM;   chronic, progressive BLE and L arm swelling and associated pain; limited with basic and instrumental ADLs requiring standing and walking > 15 mintes; difficulty fitting street shoes and LB clothing and UB clothing, difficulty lifting, reaching, carrying to perform self care, home management and productive/ work related activities    Repetition Increases Symptoms    Special Tests + stemer BLE and L hand    Pain Onset --   pain onset estimated feb 2017                       OT Treatments/Exercises (OP) - 04/02/20 0001      Manual Therapy   Manual  therapy comments anatomical measurements completed for LLE stockings, LUE arm sleeve and L glove                       OT Long Term Goals - 02/21/20 1500      OT LONG TERM GOAL #1   Title Pt will be able to verbalize signs and symptoms of cellulitis infection and identify 4 common lymphedema precautions using printed resource for reference (modified independence) with Max assist from spouse to limit LE progression over time.    Baseline Max A    Time 4    Period Days    Status Achieved      OT LONG TERM GOAL #2   Title to ensure optimal limb volume  reduction, to limit infection risk and to limit LE progression.Pt able to apply short stretch, multi-layered compression wraps using correct gradient techniques with modified independence (extra time) after skilled training for lymphedema self care to achieve limb volume reduction and limit progression of lymphedema.    Baseline dependent    Time 4    Period Days    Status Achieved      OT LONG TERM GOAL #3   Title Pt to achieve at least 10% RLE limb volume reduction in BLE below the knees, and 10%limb volume reduction in LUE during Intensive Phase CDT to improve safe functional mobility and ambulation, to improve functional performance of basic and instrumental ADLs, to limit infection risk and chronic LE progression.    Baseline Max A    Time 12    Period Weeks    Status Partially Met   Met on L w/ 13.44% reduction, and nearly on the R w/ 7.85%      OT LONG TERM GOAL #4   Title Pt will achieve and sustain at least 85%  compliance with daily LE self-care home program (skin care, lymphatic pumping therex, compression and simple self-MLD) during Intensive Phase CDT to limit limb swelling, reduce infection risk and limit LE progression.    Baseline Max A    Time 12    Period Weeks    Status Achieved      OT LONG TERM GOAL #5   Title Once issued Pt will be able to don and doff appropriate compression garments and/ or devices using correct techniques and assistive devices with modified independence and extra time  for optimal LE management to limit progression over time.    Baseline Max A    Time 12    Period Weeks    Status Achieved      OT LONG TERM GOAL #6   Title During self-management phase of CDT Pt will retain limb volume reductions achieved during Intensive Phase CDT with no more than 3% volume increase to limit LE progression and further functional decline.    Baseline Max A    Time 6    Period Months    Status On-going                 Plan - 04/02/20 1630    Clinical  Impression Statement Repeated anatomical measurements for sustom compression garments, including Elvarex Soft knee highs, ccl 2, Elvarex Soft L arm sleeve, ccl 1 and Elvarex Plus glove, ccl 1. No charge for today's visit due to long delay for patient  when initial measurements were lost and not processed for delivery. Pt will call to schedule fitting assessment when they arrive.  Patient will benefit from skilled therapeutic intervention in order to improve the following deficits and impairments:           Visit Diagnosis: Lymphedema, not elsewhere classified    Problem List Patient Active Problem List   Diagnosis Date Noted   Hypertensive disorder 12/13/2019   Neuropathy 12/13/2019   Chronic GERD    Migraine headache 02/17/2017   Swelling of limb 06/28/2016   Lymphedema 06/28/2016   Pain in limb 06/28/2016   Hyperglycemia 02/03/2015   DVT of lower limb, acute ([place]) 01/08/2014   High risk medication use 01/08/2014   Hyperlipidemia 01/08/2014   OSA (obstructive sleep apnea) 01/08/2014    Andrey Spearman, MS, OTR/L, [facility] 04/02/20 4:33 PM  [place] MAIN [facility] SERVICES [place], [place], [address] Phone: [phone]   Fax:  [phone]  Name: LASONIA CASINO MRN: [id number] Date of Birth: 1966-05-16

## 2020-04-30 ENCOUNTER — Ambulatory Visit: Payer: BC Managed Care – PPO | Attending: Nurse Practitioner | Admitting: Occupational Therapy

## 2020-04-30 ENCOUNTER — Other Ambulatory Visit: Payer: Self-pay

## 2020-04-30 DIAGNOSIS — I890 Lymphedema, not elsewhere classified: Secondary | ICD-10-CM | POA: Diagnosis present

## 2020-04-30 NOTE — Therapy (Signed)
[place] MAIN [facility] SERVICES [address] [place], [place], [address] Phone: [phone]   Fax:  [phone]  Occupational Therapy Treatment  Patient Details  Name: Sharon Barnes MRN: [id number] Date of Birth: Sep 28, 1965 Referring Provider (OT): Eulogio Ditch, NP   Encounter Date: 04/30/2020   OT End of Session - 04/30/20 1504    Visit Number 22    Number of Visits 36    Date for OT Re-Evaluation 05/12/20    OT Start Time 1100    OT Stop Time 1210    OT Time Calculation (min) 70 min    Equipment Utilized During Treatment friction gloves    Activity Tolerance Patient tolerated treatment well;No increased pain    Behavior During Therapy WFL for tasks assessed/performed           Past Medical History:  Diagnosis Date  . Arthritis    knees  . DVT (deep venous thrombosis) ([place]) 07/15/2014   Right leg  . GERD (gastroesophageal reflux disease)   . Hyperlipidemia   . Lymphedema   . Motion sickness    circular motion  . Numbness of feet   . PONV (postoperative nausea and vomiting)    after hysterectomy  . Sleep apnea    CPAP    Past Surgical History:  Procedure Laterality Date  . ABDOMINAL HYSTERECTOMY    . CHOLECYSTECTOMY    . COLONOSCOPY WITH PROPOFOL N/A 10/05/2019   Procedure: COLONOSCOPY WITH PROPOFOL;  Surgeon: Lin Landsman, MD;  Location: [place];  Service: Endoscopy;  Laterality: N/A;  . ESOPHAGOGASTRODUODENOSCOPY (EGD) WITH PROPOFOL N/A 10/05/2019   Procedure: ESOPHAGOGASTRODUODENOSCOPY (EGD) WITH PROPOFOL;  Surgeon: Lin Landsman, MD;  Location: [place];  Service: Endoscopy;  Laterality: N/A;  . KNEE ARTHROSCOPY WITH MENISCAL REPAIR Right 07/24/2019   Procedure: KNEE ARTHROSCOPY WITH PARTIAL MENISECTOMY CHONDROPLASTY, PATELLOFEMORAL COMPARTMENT, Plica Removal;  Surgeon: Leim Fabry, MD;  Location: [place];  Service: Orthopedics;  Laterality: Right;  sleep apnea  . NASAL  SINUS SURGERY    . POLYPECTOMY  10/05/2019   Procedure: POLYPECTOMY;  Surgeon: Lin Landsman, MD;  Location: [place];  Service: Endoscopy;;    There were no vitals filed for this visit.   Subjective Assessment - 04/30/20 1456    Subjective  Pt returns for custom garment fitting for ccl 1 LUE compression armsleeve and glove, and LLE  knee length compression stockings. Pt denies leg pain associated w/ LE today. Pt reports she saw plastics physician at [facility] who ruled out UE lymphatic dydfunction with imaging. Pt tells me an UE venous study is pending.    Pertinent History LE relevant PMH: OSA, cpap nightly. has not been calibrated in > 2 yrs. R meniscus tear and repair. obesity,    Limitations impaired functional ambulation and mobility;  impaired standing balance;  decreased LB muscle strength, decreased LE AROM;   chronic, progressive BLE and L arm swelling and associated pain; limited with basic and instrumental ADLs requiring standing and walking > 15 mintes; difficulty fitting street shoes and LB clothing and UB clothing, difficulty lifting, reaching, carrying to perform self care, home management and productive/ work related activities    Repetition Increases Symptoms    Special Tests + stemer BLE and L hand    Pain Onset --   pain onset estimated feb 2017  OT Treatments/Exercises (OP) - 04/30/20 0001      ADLs   ADL Education Given Yes                  OT Education - 04/30/20 1503    Education Details Pt edu for donning and doffing, wear and care of custom compression garments.    Person(s) Educated Patient    Methods Explanation;Demonstration    Comprehension Verbalized understanding;Returned demonstration               OT Long Term Goals - 02/21/20 1500      OT LONG TERM GOAL #1   Title Pt will be able to verbalize signs and symptoms of cellulitis infection and identify 4 common lymphedema precautions using  printed resource for reference (modified independence) with Max assist from spouse to limit LE progression over time.    Baseline Max A    Time 4    Period Days    Status Achieved      OT LONG TERM GOAL #2   Title to ensure optimal limb volume reduction, to limit infection risk and to limit LE progression.Pt able to apply short stretch, multi-layered compression wraps using correct gradient techniques with modified independence (extra time) after skilled training for lymphedema self care to achieve limb volume reduction and limit progression of lymphedema.    Baseline dependent    Time 4    Period Days    Status Achieved      OT LONG TERM GOAL #3   Title Pt to achieve at least 10% RLE limb volume reduction in BLE below the knees, and 10%limb volume reduction in LUE during Intensive Phase CDT to improve safe functional mobility and ambulation, to improve functional performance of basic and instrumental ADLs, to limit infection risk and chronic LE progression.    Baseline Max A    Time 12    Period Weeks    Status Partially Met   Met on L w/ 13.44% reduction, and nearly on the R w/ 7.85%      OT LONG TERM GOAL #4   Title Pt will achieve and sustain at least 85%  compliance with daily LE self-care home program (skin care, lymphatic pumping therex, compression and simple self-MLD) during Intensive Phase CDT to limit limb swelling, reduce infection risk and limit LE progression.    Baseline Max A    Time 12    Period Weeks    Status Achieved      OT LONG TERM GOAL #5   Title Once issued Pt will be able to don and doff appropriate compression garments and/ or devices using correct techniques and assistive devices with modified independence and extra time  for optimal LE management to limit progression over time.    Baseline Max A    Time 12    Period Weeks    Status Achieved      OT LONG TERM GOAL #6   Title During self-management phase of CDT Pt will retain limb volume reductions  achieved during Intensive Phase CDT with no more than 3% volume increase to limit LE progression and further functional decline.    Baseline Max A    Time 6    Period Months    Status On-going                 Plan - 04/30/20 1504    Clinical Impression Statement Finger stubs on glove are too long. Completed new measurements and determined initial  finger length measurements are correct. Also adjusted palm length and requested fabric change from Elvarex Plus to Elvarex seamless soft glove to improve comfor. Faxed changes and request for remake to DME vendor. LLE garments appear to fit and function very well. Pt reports comfort and good control of leg swelling in  custom knee highs. After teaching Pt donning technique using assistive device, arm sleeve fit appears excellent. Pt finds seam a little irritation , so we covered it with kinesio tape. I believe this will soften and become less irritationg after laundering. Will refit custom glove remake as soon as vendor returns garment.           Patient will benefit from skilled therapeutic intervention in order to improve the following deficits and impairments:           Visit Diagnosis: Lymphedema, not elsewhere classified    Problem List Patient Active Problem List   Diagnosis Date Noted  . Hypertensive disorder 12/13/2019  . Neuropathy 12/13/2019  . Chronic GERD   . Migraine headache 02/17/2017  . Swelling of limb 06/28/2016  . Lymphedema 06/28/2016  . Pain in limb 06/28/2016  . Hyperglycemia 02/03/2015  . DVT of lower limb, acute ([place]) 01/08/2014  . High risk medication use 01/08/2014  . Hyperlipidemia 01/08/2014  . OSA (obstructive sleep apnea) 01/08/2014    Andrey Spearman, MS, OTR/L, [facility] 04/30/20 3:05 PM  [place] MAIN [facility] SERVICES [address] [place], [place], [address] Phone: [phone]   Fax:  [phone]  Name: Sharon Barnes MRN: [id number] Date of  Birth: June 09, 1966

## 2020-05-23 DIAGNOSIS — M0609 Rheumatoid arthritis without rheumatoid factor, multiple sites: Secondary | ICD-10-CM | POA: Insufficient documentation

## 2020-06-20 ENCOUNTER — Other Ambulatory Visit: Payer: Self-pay | Admitting: Gastroenterology

## 2020-06-25 ENCOUNTER — Encounter (INDEPENDENT_AMBULATORY_CARE_PROVIDER_SITE_OTHER): Payer: Self-pay | Admitting: Nurse Practitioner

## 2020-06-25 ENCOUNTER — Ambulatory Visit (INDEPENDENT_AMBULATORY_CARE_PROVIDER_SITE_OTHER): Payer: BC Managed Care – PPO | Admitting: Nurse Practitioner

## 2020-06-25 ENCOUNTER — Other Ambulatory Visit: Payer: Self-pay

## 2020-06-25 VITALS — BP 120/76 | HR 75 | Ht 65.0 in | Wt 214.0 lb

## 2020-06-25 DIAGNOSIS — I10 Essential (primary) hypertension: Secondary | ICD-10-CM | POA: Diagnosis not present

## 2020-06-25 DIAGNOSIS — R600 Localized edema: Secondary | ICD-10-CM | POA: Diagnosis not present

## 2020-06-25 DIAGNOSIS — M7989 Other specified soft tissue disorders: Secondary | ICD-10-CM

## 2020-06-25 DIAGNOSIS — M069 Rheumatoid arthritis, unspecified: Secondary | ICD-10-CM | POA: Insufficient documentation

## 2020-06-29 ENCOUNTER — Encounter (INDEPENDENT_AMBULATORY_CARE_PROVIDER_SITE_OTHER): Payer: Self-pay | Admitting: Nurse Practitioner

## 2020-06-29 NOTE — Progress Notes (Signed)
Subjective:    Patient ID: Sharon Barnes, female    DOB: December 14, 1965, 54 y.o.   MRN: [id number] Chief Complaint  Patient presents with  . Follow-up    1 year F/U no studies     The patient returns today for follow-up studies regarding her lower extremity edema.  Since the patient's last visit she has recently sought a second opinion and had a lymphoscintigram done which showed no evidence of lymphedema.  Patient also had a venous reflux study which showed no evidence of reflux.  A pelvic venogram which was done for a different issue also found no evidence of obvious obstruction within the iliac veins.  Patient has been to occupational therapy for and found that it was helpful for controlling her edema.  The patient also continues to adhere to medical grade 1 compression therapy as well.  The patient notes that she has a known history of rheumatoid arthritis in addition to some follow-up testing to be done with her cardiologist.  She denies any claudication-like symptoms or rest pain.   Review of Systems  Cardiovascular: Positive for leg swelling.  All other systems reviewed and are negative.      Objective:   Physical Exam Vitals reviewed.  HENT:     Head: Normocephalic.  Cardiovascular:     Rate and Rhythm: Normal rate.  Pulmonary:     Effort: Pulmonary effort is normal.  Neurological:     Mental Status: She is alert and oriented to person, place, and time.  Psychiatric:        Mood and Affect: Mood normal.        Behavior: Behavior normal.        Thought Content: Thought content normal.        Judgment: Judgment normal.     BP 120/76   Pulse 75   Ht 5\' 5"  (1.651 m)   Wt 214 lb (97.1 kg)   BMI 35.61 kg/m   Past Medical History:  Diagnosis Date  . Arthritis    knees  . DVT (deep venous thrombosis) ([place]) 07/15/2014   Right leg  . GERD (gastroesophageal reflux disease)   . Hyperlipidemia   . Lymphedema   . Motion sickness    circular motion  . Numbness of feet    . PONV (postoperative nausea and vomiting)    after hysterectomy  . Sleep apnea    CPAP    Social History   Socioeconomic History  . Marital status: Divorced    Spouse name: Not on file  . Number of children: Not on file  . Years of education: Not on file  . Highest education level: Not on file  Occupational History  . Not on file  Tobacco Use  . Smoking status: Never Smoker  . Smokeless tobacco: Never Used  Vaping Use  . Vaping Use: Never used  Substance and Sexual Activity  . Alcohol use: Yes    Comment: 1x/month  . Drug use: No  . Sexual activity: Not on file  Other Topics Concern  . Not on file  Social History Narrative  . Not on file   Social Determinants of Health   Financial Resource Strain:   . Difficulty of Paying Living Expenses: Not on file  Food Insecurity:   . Worried About Charity fundraiser in the Last Year: Not on file  . Ran Out of Food in the Last Year: Not on file  Transportation Needs:   . Lack of Transportation (  Medical): Not on file  . Lack of Transportation (Non-Medical): Not on file  Physical Activity:   . Days of Exercise per Week: Not on file  . Minutes of Exercise per Session: Not on file  Stress:   . Feeling of Stress : Not on file  Social Connections:   . Frequency of Communication with Friends and Family: Not on file  . Frequency of Social Gatherings with Friends and Family: Not on file  . Attends Religious Services: Not on file  . Active Member of Clubs or Organizations: Not on file  . Attends Archivist Meetings: Not on file  . Marital Status: Not on file  Intimate Partner Violence:   . Fear of Current or Ex-Partner: Not on file  . Emotionally Abused: Not on file  . Physically Abused: Not on file  . Sexually Abused: Not on file    Past Surgical History:  Procedure Laterality Date  . ABDOMINAL HYSTERECTOMY    . CHOLECYSTECTOMY    . COLONOSCOPY WITH PROPOFOL N/A 10/05/2019   Procedure: COLONOSCOPY WITH  PROPOFOL;  Surgeon: Lin Landsman, MD;  Location: [place];  Service: Endoscopy;  Laterality: N/A;  . ESOPHAGOGASTRODUODENOSCOPY (EGD) WITH PROPOFOL N/A 10/05/2019   Procedure: ESOPHAGOGASTRODUODENOSCOPY (EGD) WITH PROPOFOL;  Surgeon: Lin Landsman, MD;  Location: [place];  Service: Endoscopy;  Laterality: N/A;  . KNEE ARTHROSCOPY WITH MENISCAL REPAIR Right 07/24/2019   Procedure: KNEE ARTHROSCOPY WITH PARTIAL MENISECTOMY CHONDROPLASTY, PATELLOFEMORAL COMPARTMENT, Plica Removal;  Surgeon: Leim Fabry, MD;  Location: [place];  Service: Orthopedics;  Laterality: Right;  sleep apnea  . NASAL SINUS SURGERY    . POLYPECTOMY  10/05/2019   Procedure: POLYPECTOMY;  Surgeon: Lin Landsman, MD;  Location: [place];  Service: Endoscopy;;    Family History  Problem Relation Age of Onset  . Heart disease Mother   . Cancer Father   . Heart disease Maternal Grandmother   . Cancer Maternal Grandfather   . Emphysema Paternal Grandmother     No Known Allergies  CBC Latest Ref Rng & Units 01/29/2014 09/26/2012 03/28/2012  WBC 3.6 - 11.0 x10 3/mm 3 12.6(H) 11.6(H) 7.6  Hemoglobin 12.0 - 16.0 g/dL 13.4 13.1 12.9  Hematocrit 35.0 - 47.0 % 41.3 39.6 37.3  Platelets 150 - 440 x10 3/mm 3 229 228 187      CMP  No results found for: NA, K, CL, CO2, GLUCOSE, BUN, CREATININE, CALCIUM, PROT, ALBUMIN, AST, ALT, ALKPHOS, BILITOT, GFRNONAA, GFRAA        Assessment & Plan:   1. Swelling of limb After a thorough review of the patient's outside studies including a Lymphoscintigram, lower extremity reflux studies as well as pelvic venogram, there are no obvious abnormalities within the patient's vascular anatomy.  Lymphoscintigram also demonstrates no evidence of lymphedema.  Based on this it is felt that her current edema is not related to vascular causes.  Other possible causes can include autoimmune or endocrine in addition to other systemic causes such  as cardiac, for which the patient has upcoming appointment.  Based on this we will have the patient follow-up on an as-needed basis.  2. HTN, goal below 140/80 Continue antihypertensive medications as already ordered, these medications have been reviewed and there are no changes at this time.    Current Outpatient Medications on File Prior to Visit  Medication Sig Dispense Refill  . cetirizine (ZYRTEC) 10 MG tablet Take by mouth.    . clonazePAM (KLONOPIN) 0.5 MG  tablet Take by mouth as needed.     . clonazePAM (KLONOPIN) 0.5 MG tablet Take by mouth.    . Cyanocobalamin (RA VITAMIN B-12 TR) 1000 MCG TBCR Take by mouth.    . fluticasone (FLONASE) 50 MCG/ACT nasal spray Place into both nostrils 2 (two) times daily.    . folic acid (FOLVITE) 1 MG tablet Take by mouth.    . gabapentin (NEURONTIN) 100 MG capsule TAKE 1 CAPSULE BY MOUTH THREE TIMES A DAY    . gabapentin (NEURONTIN) 300 MG capsule gabapentin 300 mg capsule   300 mg every day by oral route.    . methotrexate (RHEUMATREX) 2.5 MG tablet 6 tabs weekly    . Multiple Vitamin (MULTI-VITAMINS) TABS Take by mouth.    . traZODone (DESYREL) 50 MG tablet Take by mouth.    . hydrochlorothiazide (MICROZIDE) 12.5 MG capsule hydrochlorothiazide 12.5 mg capsule   1 capsule every day by oral route.    Marland Kitchen omeprazole (PRILOSEC) 40 MG capsule TAKE 1 CAPSULE (40 MG TOTAL) BY MOUTH 2 (TWO) TIMES DAILY BEFORE A MEAL. 180 capsule 0   No current facility-administered medications on file prior to visit.    There are no Patient Instructions on file for this visit. No follow-ups on file.   Kris Hartmann, NP

## 2020-09-13 ENCOUNTER — Other Ambulatory Visit: Payer: Self-pay | Admitting: Gastroenterology

## 2020-09-15 NOTE — Telephone Encounter (Signed)
Last office visit 12/20/2019 Chronic GERD  Last refill 06/23/2020 0 refills  Plan for visit on 12/20/2019 Well-controlled on omeprazole 40 mg twice daily, started in 2/21, suggested to decrease to once a day for about a month, then stop.

## 2021-10-20 ENCOUNTER — Other Ambulatory Visit: Payer: Self-pay | Admitting: Internal Medicine

## 2021-10-20 DIAGNOSIS — R110 Nausea: Secondary | ICD-10-CM

## 2021-10-20 DIAGNOSIS — R1084 Generalized abdominal pain: Secondary | ICD-10-CM

## 2021-10-20 DIAGNOSIS — R634 Abnormal weight loss: Secondary | ICD-10-CM

## 2021-10-21 ENCOUNTER — Ambulatory Visit
Admission: RE | Admit: 2021-10-21 | Discharge: 2021-10-21 | Disposition: A | Discharge status: Home / Self Care | Payer: BC Managed Care – PPO | Source: Ambulatory Visit | Attending: Internal Medicine | Admitting: Internal Medicine

## 2021-10-21 DIAGNOSIS — R110 Nausea: Secondary | ICD-10-CM | POA: Diagnosis present

## 2021-10-21 DIAGNOSIS — R634 Abnormal weight loss: Secondary | ICD-10-CM | POA: Diagnosis present

## 2021-10-21 DIAGNOSIS — R1084 Generalized abdominal pain: Secondary | ICD-10-CM | POA: Diagnosis present

## 2021-10-21 MED ORDER — IOHEXOL 300 MG/ML  SOLN
100.0000 mL | Freq: Once | INTRAMUSCULAR | Status: AC | PRN
Start: 2021-10-21 — End: 2021-10-21
  Administered 2021-10-21: 100 mL via INTRAVENOUS

## 2021-10-23 ENCOUNTER — Telehealth: Payer: Self-pay | Admitting: Gastroenterology

## 2021-10-23 NOTE — Telephone Encounter (Signed)
Called patient and patient states she has lost 20 pounds in a month. Patient is having epigastric pain that has been going on for weeks and is tender to touch. She states she is nausea and can not eat because she feels sick is able to keep some liquids down.  She states she is having center chest pain. She states the pain is a aching. Patient is having upper back pain. The chest pain has been going on since yesterday. She denies any shortness of breath. Advised patient  that she needed to call her PCP and be seen today for the chest pain and upper back pain. If pcp can not get her in she needs to go to the ER or urgent care but needed to be seen today for the  ?

## 2021-10-23 NOTE — Telephone Encounter (Signed)
Pt has appointment on 3/9 but was inquiring about something that you would suggest for nausea and weakness until her appointment ?

## 2021-10-23 NOTE — Telephone Encounter (Incomplete)
°

## 2021-10-29 ENCOUNTER — Other Ambulatory Visit: Payer: Self-pay

## 2021-10-29 ENCOUNTER — Encounter: Payer: Self-pay | Admitting: Gastroenterology

## 2021-10-29 ENCOUNTER — Ambulatory Visit (INDEPENDENT_AMBULATORY_CARE_PROVIDER_SITE_OTHER): Payer: BC Managed Care – PPO | Admitting: Gastroenterology

## 2021-10-29 VITALS — BP 128/85 | HR 76 | Temp 98.1°F | Ht 65.0 in | Wt 198.0 lb

## 2021-10-29 DIAGNOSIS — R140 Abdominal distension (gaseous): Secondary | ICD-10-CM

## 2021-10-29 DIAGNOSIS — R110 Nausea: Secondary | ICD-10-CM | POA: Diagnosis not present

## 2021-10-29 DIAGNOSIS — R1013 Epigastric pain: Secondary | ICD-10-CM

## 2021-10-29 NOTE — Progress Notes (Signed)
Cephas Darby, MD [address]  [place], [place] [address]  Main: [phone]  Fax: [phone]    Gastroenterology Consultation  Referring Provider:     Idelle Crouch, MD Primary Care Physician:  Idelle Crouch, MD Primary Gastroenterologist:  Dr. Cephas Darby Reason for Consultation: Epigastric pain, nausea, unintentional weight loss       HPI:   Sharon Barnes is a 56 y.o. female referred by Dr. Doy Hutching, Leonie Douglas, MD  for consultation & management of symptomatic hemorrhoids and chronic GERD.  Patient reports that she has been suffering from hemorrhoidal symptoms including perianal itching, discomfort, prolapse, burning.  Also, she denies constipation, she does report straining.  She denies rectal bleeding.  She does report flareup of the symptoms about once or twice a month that are partially relieved with over-the-counter hemorrhoidal creams.  She wanted to discuss about hemorrhoid management before undergoing screening colonoscopy.  She also has history of chronic GERD, OSA on CPAP, nocturnal heartburn.  Her symptoms are responding to antireflux lifestyle, she is following dietary modifications which she thinks is helping.  She denies dysphagia, nausea or vomiting.  She does report abdominal bloating, denies abdominal pain.  She does have remote history of DVT with hypercoagulable work-up negative, history of lymphedema in lower extremities, seeing Dr. Festus Holts for lymphedema in her left upper arm and is scheduled to undergo ultrasound next week.  Patient is also evaluated by cardiology in the past for swelling of legs and cardiac work-up was negative  Patient is a high school teacher and teaches business  She does not smoke, alcohol occasionally  Follow-up visit 12/21/2019 Patient has been doing well with regards to her GERD symptoms. She reports that omeprazole 40 mg twice daily has resolved her reflux symptoms.  She also reports that her rectal bleeding has  resolved.  She would like to defer hemorrhoid ligation at this time  Follow-up visit 10/29/2021 Patient has not seen me since 11/2019.  She made an urgent visit to see me because of approximately 2 months history of epigastric pain associated with nausea, unintentional weight loss.  Patient reports the pain is more or less constant, without any radiation, associated with abdominal bloating.  Patient lost about 20 pounds within last 6 weeks.  Her initial episode started end of January, symptoms including nausea, vomiting, loose stools epigastric pain.  She thought it was a stomach bug, was able to start feeling better, she then got diagnosed with COVID and her upper GI symptoms worsened.  She reports alternating diarrhea and constipation.  She underwent work-up including CT abdomen pelvis with contrast which was unremarkable, labs including CBC, CMP, troponins were normal.  She has been taking Zofran, Phenergan as needed for nausea.  She is taking Prilosec 40 mg daily.  She is also prescribed sucralfate 1 g 4 times daily.  She has tried Pepto-Bismol, Mylanta with some relief.  She is still not able to eat regular meals.  She has stopped taking Cimzia and methotrexate for her rheumatoid arthritis since onset of her upper GI symptoms.  NSAIDs: None  Antiplts/Anticoagulants/Anti thrombotics: None  GI Procedures: Reports having had an upper endoscopy more than 15 years ago EGD and colonoscopy 10/05/2019 - Normal duodenal bulb and second portion of the duodenum. - Erythematous mucosa in the gastric body. Biopsied. - Esophagogastric landmarks identified. - Normal gastroesophageal junction and esophagus. Biopsied.  - Perianal skin tags found on perianal exam. - The examined portion of the  ileum was normal. - One 5 mm polyp in the cecum, removed with mucosal resection. Resected and retrieved. - One 6 mm polyp in the ascending colon, removed with a cold snare. Resected and retrieved. - Non-bleeding external  hemorrhoids. - The examination was otherwise normal. - Mucosal resection was performed. Resection and retrieval were complete.  DIAGNOSIS:  A. STOMACH, RANDOM; BIOPSY:  - OXYNTIC MUCOSA WITH NO SIGNIFICANT PATHOLOGIC ALTERATION.  - NEGATIVE FOR ACTIVE INFLAMMATION AND H PYLORI.  - NEGATIVE FOR INTESTINAL METAPLASIA, DYSPLASIA, AND MALIGNANCY.   B. ESOPHAGUS, RANDOM; BIOPSY:  - SQUAMOUS MUCOSA WITH NO SIGNIFICANT PATHOLOGIC ALTERATION.  - NEGATIVE FOR INFLAMMATION, INTESTINAL METAPLASIA, DYSPLASIA, AND  MALIGNANCY.   C. COLON POLYPS X2, CECUM AND ASCENDING; COLD SNARE:  - FRAGMENTS WITH FEATURES SUGGESTIVE OF SESSILE SERRATED POLYP (2).  - FRAGMENTS OF BENIGN INFLAMMATORY POLYP (2).  - NEGATIVE FOR DYSPLASIA AND MALIGNANCY.  She denies family history of GI malignancy  Past Medical History:  Diagnosis Date   Arthritis    knees   DVT (deep venous thrombosis) ([facility]) 07/15/2014   Right leg   GERD (gastroesophageal reflux disease)    Hyperlipidemia    Lymphedema    Motion sickness    circular motion   Numbness of feet    PONV (postoperative nausea and vomiting)    after hysterectomy   Sleep apnea    CPAP    Past Surgical History:  Procedure Laterality Date   ABDOMINAL HYSTERECTOMY     CHOLECYSTECTOMY     COLONOSCOPY WITH PROPOFOL N/A 10/05/2019   Procedure: COLONOSCOPY WITH PROPOFOL;  Surgeon: Lin Landsman, MD;  Location: [facility];  Service: Endoscopy;  Laterality: N/A;   ESOPHAGOGASTRODUODENOSCOPY (EGD) WITH PROPOFOL N/A 10/05/2019   Procedure: ESOPHAGOGASTRODUODENOSCOPY (EGD) WITH PROPOFOL;  Surgeon: Lin Landsman, MD;  Location: [facility];  Service: Endoscopy;  Laterality: N/A;   KNEE ARTHROSCOPY WITH MENISCAL REPAIR Right 07/24/2019   Procedure: KNEE ARTHROSCOPY WITH PARTIAL MENISECTOMY CHONDROPLASTY, PATELLOFEMORAL COMPARTMENT, Plica Removal;  Surgeon: Leim Fabry, MD;  Location: [facility];  Service: Orthopedics;  Laterality:  Right;  sleep apnea   NASAL SINUS SURGERY     POLYPECTOMY  10/05/2019   Procedure: POLYPECTOMY;  Surgeon: Lin Landsman, MD;  Location: [facility];  Service: Endoscopy;;    Current Outpatient Medications:    acetaminophen (TYLENOL) 500 MG tablet, Tylenol Extra Strength 500 mg tablet  Take 2 tablets as needed by oral route., Disp: , Rfl:    clonazePAM (KLONOPIN) 0.5 MG tablet, Take by mouth., Disp: , Rfl:    cyanocobalamin 1000 MCG tablet, Take by mouth., Disp: , Rfl:    fluticasone (FLONASE) 50 MCG/ACT nasal spray, Place into the nose., Disp: , Rfl:    gabapentin (NEURONTIN) 300 MG capsule, Take by mouth., Disp: , Rfl:    promethazine (PHENERGAN) 12.5 MG tablet, Take 12.5 mg by mouth every 6 (six) hours as needed., Disp: , Rfl:    sucralfate (CARAFATE) 1 g tablet, Take by mouth., Disp: , Rfl:    ondansetron (ZOFRAN-ODT) 4 MG disintegrating tablet, Take 4 mg by mouth every 8 (eight) hours as needed. (Patient not taking: Reported on 10/29/2021), Disp: , Rfl:    Family History  Problem Relation Age of Onset   Heart disease Mother    Cancer Father    Heart disease Maternal Grandmother    Cancer Maternal Grandfather    Emphysema Paternal Grandmother      Social History   Tobacco Use   Smoking  status: Never   Smokeless tobacco: Never  Vaping Use   Vaping Use: Never used  Substance Use Topics   Alcohol use: Yes    Comment: 1x/month   Drug use: No    Allergies as of 10/29/2021   (No Known Allergies)    Review of Systems:    All systems reviewed and negative except where noted in HPI.   Physical Exam:  BP 128/85 (BP Location: Left Arm, Patient Position: Sitting, Cuff Size: Normal)    Pulse 76    Temp 98.1 F (36.7 C) (Oral)    Ht '5\' 5"'$  (1.651 m)    Wt 198 lb (89.8 kg)    BMI 32.95 kg/m  No LMP recorded. Patient has had a hysterectomy.  General:   Alert,  Well-developed, well-nourished, pleasant and cooperative in NAD Head:  Normocephalic and atraumatic. Eyes:   Sclera clear, no icterus.   Conjunctiva pink. Ears:  Normal auditory acuity. Nose:  No deformity, discharge, or lesions. Mouth:  No deformity or lesions,oropharynx pink & moist. Neck:  Supple; short and thick neck, no masses or thyromegaly. Lungs:  Respirations even and unlabored.  Clear throughout to auscultation.   No wheezes, crackles, or rhonchi. No acute distress. Heart:  Regular rate and rhythm; no murmurs, clicks, rubs, or gallops. Abdomen:  Normal bowel sounds. Soft, obese, epigastric tenderness, nondistended without masses, hepatosplenomegaly or hernias noted.  No guarding or rebound tenderness.   Rectal: Not performed Msk:  Symmetrical without gross deformities. Good, equal movement & strength bilaterally. Pulses:  Normal pulses noted. Extremities:  No clubbing or edema.  No cyanosis. Neurologic:  Alert and oriented x3;  grossly normal neurologically. Skin:  Intact without significant lesions or rashes. No jaundice. Psych:  Alert and cooperative. Normal mood and affect.  Imaging Studies: Reviewed  Assessment and Plan:   Sharon Barnes is a 56 y.o. female with obesity, BMI 36, OSA on CPAP, lymphedema, remote history of DVT, history of chronic GERD is seen for follow-up of approximately 2 months history of epigastric pain, nausea, abdominal bloating, unexplained weight loss  Epigastric pain, nausea Wants to undergo upper endoscopy to rule out any peptic ulcer disease Proceed with EGD next week Continue omeprazole 40 mg in the morning and take Pepcid 20 mg at night Can continue Zofran, Phenergan, sucralfate, Mylanta as needed  Abdominal bloating Check pancreatic fecal elastase levels  Constipation Try MiraLAX, samples provided   Follow up based on the above work-up   Cephas Darby, MD

## 2021-10-30 ENCOUNTER — Encounter: Payer: Self-pay | Admitting: Gastroenterology

## 2021-11-05 ENCOUNTER — Ambulatory Visit: Payer: BC Managed Care – PPO | Admitting: Anesthesiology

## 2021-11-05 ENCOUNTER — Encounter: Payer: Self-pay | Admitting: Gastroenterology

## 2021-11-05 ENCOUNTER — Encounter
Admission: RE | Disposition: A | Discharge status: Home / Self Care | Payer: Self-pay | Source: Home / Self Care | Attending: Gastroenterology

## 2021-11-05 ENCOUNTER — Telehealth: Payer: Self-pay | Admitting: Gastroenterology

## 2021-11-05 ENCOUNTER — Other Ambulatory Visit: Payer: Self-pay

## 2021-11-05 ENCOUNTER — Ambulatory Visit
Admission: RE | Admit: 2021-11-05 | Discharge: 2021-11-05 | Disposition: A | Discharge status: Home / Self Care | Payer: BC Managed Care – PPO | Attending: Gastroenterology | Admitting: Gastroenterology

## 2021-11-05 DIAGNOSIS — R634 Abnormal weight loss: Secondary | ICD-10-CM | POA: Diagnosis not present

## 2021-11-05 DIAGNOSIS — K2950 Unspecified chronic gastritis without bleeding: Secondary | ICD-10-CM | POA: Insufficient documentation

## 2021-11-05 DIAGNOSIS — R110 Nausea: Secondary | ICD-10-CM | POA: Diagnosis not present

## 2021-11-05 DIAGNOSIS — R1310 Dysphagia, unspecified: Secondary | ICD-10-CM | POA: Insufficient documentation

## 2021-11-05 DIAGNOSIS — K219 Gastro-esophageal reflux disease without esophagitis: Secondary | ICD-10-CM | POA: Insufficient documentation

## 2021-11-05 DIAGNOSIS — R1013 Epigastric pain: Secondary | ICD-10-CM | POA: Diagnosis present

## 2021-11-05 HISTORY — PX: ESOPHAGOGASTRODUODENOSCOPY (EGD) WITH PROPOFOL: SHX5813

## 2021-11-05 HISTORY — DX: Rheumatoid arthritis, unspecified: M06.9

## 2021-11-05 SURGERY — ESOPHAGOGASTRODUODENOSCOPY (EGD) WITH PROPOFOL
Anesthesia: General | Site: Mouth

## 2021-11-05 MED ORDER — PROPOFOL 500 MG/50ML IV EMUL: INTRAVENOUS | Status: DC | PRN

## 2021-11-05 MED ORDER — LIDOCAINE HCL (CARDIAC) PF 100 MG/5ML IV SOSY
PREFILLED_SYRINGE | INTRAVENOUS | Status: DC | PRN
Start: 2021-11-05 — End: 2021-11-05

## 2021-11-05 MED ORDER — LACTATED RINGERS IV SOLN: INTRAVENOUS | Status: DC

## 2021-11-05 MED ORDER — PROPOFOL 500 MG/50ML IV EMUL
INTRAVENOUS | Status: DC | PRN
  Administered 2021-11-05: 160 ug/kg/min via INTRAVENOUS

## 2021-11-05 MED ORDER — ACETAMINOPHEN 325 MG PO TABS: 325.0000 mg | ORAL_TABLET | Freq: Once | ORAL | Status: DC

## 2021-11-05 MED ORDER — PROPOFOL 10 MG/ML IV BOLUS
INTRAVENOUS | Status: DC | PRN
  Administered 2021-11-05: 70 mg via INTRAVENOUS
  Administered 2021-11-05: 50 mg via INTRAVENOUS

## 2021-11-05 MED ORDER — SODIUM CHLORIDE 0.9 % IV SOLN: INTRAVENOUS | Status: DC

## 2021-11-05 MED ORDER — GLYCOPYRROLATE 0.2 MG/ML IJ SOLN
INTRAMUSCULAR | Status: DC | PRN
  Administered 2021-11-05: .1 mg via INTRAVENOUS

## 2021-11-05 MED ORDER — LIDOCAINE HCL (CARDIAC) PF 100 MG/5ML IV SOSY
PREFILLED_SYRINGE | INTRAVENOUS | Status: DC | PRN
  Administered 2021-11-05: 60 mg via INTRAVENOUS

## 2021-11-05 MED ORDER — ACETAMINOPHEN 160 MG/5ML PO SOLN: 325.0000 mg | Freq: Once | ORAL | Status: DC

## 2021-11-05 SURGICAL SUPPLY — 34 items
BALLN DILATOR 10-12 8 (BALLOONS)
BALLN DILATOR 12-15 8 (BALLOONS)
BALLN DILATOR 15-18 8 (BALLOONS)
BALLN DILATOR CRE 0-12 8 (BALLOONS)
BALLN DILATOR ESOPH 8 10 CRE (MISCELLANEOUS) IMPLANT
BALLOON DILATOR 12-15 8 (BALLOONS) IMPLANT
BALLOON DILATOR 15-18 8 (BALLOONS) IMPLANT
BALLOON DILATOR CRE 0-12 8 (BALLOONS) IMPLANT
BLOCK BITE 60FR ADLT L/F GRN (MISCELLANEOUS) ×2 IMPLANT
CLIP HMST 235XBRD CATH ROT (MISCELLANEOUS) IMPLANT
CLIP RESOLUTION 360 11X235 (MISCELLANEOUS)
ELECT REM PT RETURN 9FT ADLT (ELECTROSURGICAL)
ELECTRODE REM PT RTRN 9FT ADLT (ELECTROSURGICAL) IMPLANT
FCP ESCP3.2XJMB 240X2.8X (MISCELLANEOUS)
FORCEPS BIOP RAD 4 LRG CAP 4 (CUTTING FORCEPS) IMPLANT
FORCEPS BIOP RJ4 240 W/NDL (MISCELLANEOUS)
FORCEPS ESCP3.2XJMB 240X2.8X (MISCELLANEOUS) IMPLANT
GOWN CVR UNV OPN BCK APRN NK (MISCELLANEOUS) ×2 IMPLANT
GOWN ISOL THUMB LOOP REG UNIV (MISCELLANEOUS) ×4
INJECTOR VARIJECT VIN23 (MISCELLANEOUS) IMPLANT
KIT DEFENDO VALVE AND CONN (KITS) IMPLANT
KIT PRC NS LF DISP ENDO (KITS) ×1 IMPLANT
KIT PROCEDURE OLYMPUS (KITS) ×2
MANIFOLD NEPTUNE II (INSTRUMENTS) ×2 IMPLANT
MARKER SPOT ENDO TATTOO 5ML (MISCELLANEOUS) IMPLANT
RETRIEVER NET PLAT FOOD (MISCELLANEOUS) IMPLANT
SNARE SHORT THROW 13M SML OVAL (MISCELLANEOUS) IMPLANT
SNARE SHORT THROW 30M LRG OVAL (MISCELLANEOUS) IMPLANT
SPOT EX ENDOSCOPIC TATTOO (MISCELLANEOUS)
SYR INFLATION 60ML (SYRINGE) IMPLANT
TRAP ETRAP POLY (MISCELLANEOUS) IMPLANT
VARIJECT INJECTOR VIN23 (MISCELLANEOUS)
WATER STERILE IRR 250ML POUR (IV SOLUTION) ×2 IMPLANT
WIRE CRE 18-20MM 8CM F G (MISCELLANEOUS) IMPLANT

## 2021-11-05 NOTE — Anesthesia Procedure Notes (Signed)
Date/Time: 11/05/2021 9:17 AM ?Performed by: Dionne Bucy, CRNA ?Pre-anesthesia Checklist: Patient identified, Emergency Drugs available, Suction available, Patient being monitored and Timeout performed ?Patient Re-evaluated:Patient Re-evaluated prior to induction ?Oxygen Delivery Method: Nasal cannula ?Induction Type: IV induction ?Placement Confirmation: positive ETCO2 ? ? ? ? ?

## 2021-11-05 NOTE — Telephone Encounter (Signed)
Patient called wanting to know the results of some lab work (drop off sample). Patient requesting a call back. ?

## 2021-11-05 NOTE — H&P (Signed)
?Sharon Darby, MD ?[address]  ?[address]  ?[place], [place] [address]  ?Main: [phone]  ?Fax: [phone] ?Pager: [phone] ? ?Primary Care Physician:  Idelle Crouch, MD ?Primary Gastroenterologist:  Dr. Cephas Barnes ? ?Pre-Procedure History & Physical: ?HPI:  Sharon Barnes is a 56 y.o. female is here for an endoscopy. ?  ?Past Medical History:  ?Diagnosis Date  ? Arthritis   ? knees  ? DVT (deep venous thrombosis) ([facility]) 07/15/2014  ? Right leg  ? GERD (gastroesophageal reflux disease)   ? Hyperlipidemia   ? Lymphedema   ? Motion sickness   ? circular motion  ? Numbness of feet   ? PONV (postoperative nausea and vomiting)   ? after hysterectomy  ? Rheumatoid arthritis ([facility])   ? Sleep apnea   ? CPAP  ? ? ?Past Surgical History:  ?Procedure Laterality Date  ? ABDOMINAL HYSTERECTOMY    ? CHOLECYSTECTOMY    ? COLONOSCOPY WITH PROPOFOL N/A 10/05/2019  ? Procedure: COLONOSCOPY WITH PROPOFOL;  Surgeon: Lin Landsman, MD;  Location: [facility];  Service: Endoscopy;  Laterality: N/A;  ? ESOPHAGOGASTRODUODENOSCOPY (EGD) WITH PROPOFOL N/A 10/05/2019  ? Procedure: ESOPHAGOGASTRODUODENOSCOPY (EGD) WITH PROPOFOL;  Surgeon: Lin Landsman, MD;  Location: [facility];  Service: Endoscopy;  Laterality: N/A;  ? KNEE ARTHROSCOPY WITH MENISCAL REPAIR Right 07/24/2019  ? Procedure: KNEE ARTHROSCOPY WITH PARTIAL MENISECTOMY CHONDROPLASTY, PATELLOFEMORAL COMPARTMENT, Plica Removal;  Surgeon: Leim Fabry, MD;  Location: [facility];  Service: Orthopedics;  Laterality: Right;  sleep apnea  ? NASAL SINUS SURGERY    ? POLYPECTOMY  10/05/2019  ? Procedure: POLYPECTOMY;  Surgeon: Lin Landsman, MD;  Location: [facility];  Service: Endoscopy;;  ? ? ?Prior to Admission medications   ?Medication Sig Start Date End Date Taking? Authorizing Provider  ?acetaminophen (TYLENOL) 500 MG tablet Tylenol Extra Strength 500 mg tablet ? Take 2 tablets as needed by oral route.   Yes  [provider]  ?clonazePAM (KLONOPIN) 0.5 MG tablet Take by mouth as needed. 07/23/21  Yes [provider]  ?cyanocobalamin 1000 MCG tablet Take by mouth.   Yes [provider]  ?fluticasone (FLONASE) 50 MCG/ACT nasal spray Place into the nose. 12/22/20  Yes [provider]  ?omeprazole (PRILOSEC) 10 MG capsule Take 40 mg by mouth 2 (two) times daily.   Yes [provider]  ?ondansetron (ZOFRAN-ODT) 4 MG disintegrating tablet Take 4 mg by mouth every 8 (eight) hours as needed. 09/19/21  Yes [provider]  ?promethazine (PHENERGAN) 12.5 MG tablet Take 12.5 mg by mouth every 6 (six) hours as needed. 09/22/21  Yes [provider]  ?sucralfate (CARAFATE) 1 g tablet Take by mouth 4 (four) times daily -  with meals and at bedtime. 10/23/21 11/22/21 Yes [provider]  ?gabapentin (NEURONTIN) 300 MG capsule Take 300 mg by mouth at bedtime. 07/01/21 07/01/22  [provider]  ? ? ?Allergies as of 10/29/2021  ? (No Known Allergies)  ? ? ?Family History  ?Problem Relation Age of Onset  ? Heart disease Mother   ? Cancer Father   ? Heart disease Maternal Grandmother   ? Cancer Maternal Grandfather   ? Emphysema Paternal Grandmother   ? ? ?Social History  ? ?Socioeconomic History  ? Marital status: Divorced  ?  Spouse name: Not on file  ? Number of children: Not on file  ? Years of education: Not on file  ? Highest education level: Not  on file  ?Occupational History  ? Not on file  ?Tobacco Use  ? Smoking status: Never  ? Smokeless tobacco: Never  ?Vaping Use  ? Vaping Use: Never used  ?Substance and Sexual Activity  ? Alcohol use: Yes  ?  Comment: 1x/month  ? Drug use: No  ? Sexual activity: Not on file  ?Other Topics Concern  ? Not on file  ?Social History Narrative  ? Not on file  ? ?Social Determinants of Health  ? ?Financial Resource Strain: Not on file  ?Food Insecurity: Not on file  ?Transportation Needs: Not on file  ?Physical Activity: Not on  file  ?Stress: Not on file  ?Social Connections: Not on file  ?Intimate Partner Violence: Not on file  ? ? ?Review of Systems: ?See HPI, otherwise negative ROS ? ?Physical Exam: ?BP 133/75   Pulse 66   Temp 97.6 ?F (36.4 ?C) (Temporal)   Ht '5\' 5"'$  (1.651 m)   Wt 88.5 kg   SpO2 98%   BMI 32.45 kg/m?  ?General:   Alert,  pleasant and cooperative in NAD ?Head:  Normocephalic and atraumatic. ?Neck:  Supple; no masses or thyromegaly. ?Lungs:  Clear throughout to auscultation.    ?Heart:  Regular rate and rhythm. ?Abdomen:  Soft, nontender and nondistended. Normal bowel sounds, without guarding, and without rebound.   ?Neurologic:  Alert and  oriented x4;  grossly normal neurologically. ? ?Impression/Plan: ?Sharon Barnes is here for an endoscopy to be performed for 2 months history of epigastric pain, nausea, abdominal bloating, unexplained weight loss ? ?Risks, benefits, limitations, and alternatives regarding  endoscopy have been reviewed with the patient.  Questions have been answered.  All parties agreeable. ? ? ?Sherri Sear, MD  11/05/2021, 8:40 AM ?

## 2021-11-05 NOTE — Transfer of Care (Signed)
Immediate Anesthesia Transfer of Care Note ? ?Patient: Sharon Barnes ? ?Procedure(s) Performed: ESOPHAGOGASTRODUODENOSCOPY (EGD) WITH PROPOFOL (Mouth) ? ?Patient Location: PACU ? ?Anesthesia Type: General ? ?Level of Consciousness: awake, alert  and patient cooperative ? ?Airway and Oxygen Therapy: Patient Spontanous Breathing and Patient connected to supplemental oxygen ? ?Post-op Assessment: Post-op Vital signs reviewed, Patient's Cardiovascular Status Stable, Respiratory Function Stable, Patent Airway and No signs of Nausea or vomiting ? ?Post-op Vital Signs: Reviewed and stable ? ?Complications: No notable events documented. ? ?

## 2021-11-05 NOTE — Op Note (Signed)
[facility] ?Gastroenterology ?Patient Name: Sharon Barnes ?Procedure Date: 11/05/2021 9:10 AM ?MRN: [id number] ?Account #: [id number] ?Date of Birth: 08/29/1965 ?Admit Type: Outpatient ?Age: 56 ?Room: [facility] OR ROOM 01 ?Gender: Female ?Note Status: Finalized ?Instrument Name: [id number] ?Procedure:             Upper GI endoscopy ?Indications:           Epigastric abdominal pain, Nausea ?Providers:             Lin Landsman MD, MD ?Referring MD:          Leonie Douglas. Doy Hutching, MD (Referring MD) ?Medicines:             General Anesthesia ?Complications:         No immediate complications. Estimated blood loss: None. ?Procedure:             Pre-Anesthesia Assessment: ?                       - Prior to the procedure, a History and Physical was  ?                       performed, and patient medications and allergies were  ?                       reviewed. The patient is competent. The risks and  ?                       benefits of the procedure and the sedation options and  ?                       risks were discussed with the patient. All questions  ?                       were answered and informed consent was obtained.  ?                       Patient identification and proposed procedure were  ?                       verified by the physician, the nurse, the  ?                       anesthesiologist, the anesthetist and the technician  ?                       in the pre-procedure area in the procedure room in the  ?                       endoscopy suite. Mental Status Examination: alert and  ?                       oriented. Airway Examination: normal oropharyngeal  ?                       airway and neck mobility. Respiratory Examination:  ?                       clear to auscultation. CV Examination: normal.  ?  Prophylactic Antibiotics: The patient does not require  ?                       prophylactic antibiotics. Prior Anticoagulants: The  ?                       patient has  taken no previous anticoagulant or  ?                       antiplatelet agents. ASA Grade Assessment: III - A  ?                       patient with severe systemic disease. After reviewing  ?                       the risks and benefits, the patient was deemed in  ?                       satisfactory condition to undergo the procedure. The  ?                       anesthesia plan was to use general anesthesia.  ?                       Immediately prior to administration of medications,  ?                       the patient was re-assessed for adequacy to receive  ?                       sedatives. The heart rate, respiratory rate, oxygen  ?                       saturations, blood pressure, adequacy of pulmonary  ?                       ventilation, and response to care were monitored  ?                       throughout the procedure. The physical status of the  ?                       patient was re-assessed after the procedure. ?                       After obtaining informed consent, the endoscope was  ?                       passed under direct vision. Throughout the procedure,  ?                       the patient's blood pressure, pulse, and oxygen  ?                       saturations were monitored continuously. The Endoscope  ?                       was introduced through the mouth, and advanced to the  ?  second part of duodenum. The upper GI endoscopy was  ?                       accomplished without difficulty. The patient tolerated  ?                       the procedure well. ?Findings: ?     The duodenal bulb and second portion of the duodenum were normal.  ?     Biopsies were taken with a cold forceps for histology. Estimated blood  ?     loss: none. ?     The entire examined stomach was normal. Biopsies were taken with a cold  ?     forceps for Helicobacter pylori testing. ?     The cardia and gastric fundus were normal on retroflexion. ?     The gastroesophageal junction and  examined esophagus were normal. ?Impression:            - Normal duodenal bulb and second portion of the  ?                       duodenum. Biopsied. ?                       - Normal stomach. Biopsied. ?                       - Normal gastroesophageal junction and esophagus. ?Recommendation:        - Discharge patient to home (with spouse). ?                       - Resume previous diet today. ?                       - Continue present medications. ?                       - Await pathology results. ?Procedure Code(s):     --- Professional --- ?                       [phone], Esophagogastroduodenoscopy, flexible,  ?                       transoral; with biopsy, single or multiple ?Diagnosis Code(s):     --- Professional --- ?                       R10.13, Epigastric pain ?                       R11.0, Nausea ?CPT copyright 2019 American Medical Association. All rights reserved. ?The codes documented in this report are preliminary and upon coder review may  ?be revised to meet current compliance requirements. ?Dr. Ulyess Mort ?Vanette Noguchi Raeanne Gathers MD, MD ?11/05/2021 9:29:15 AM ?This report has been signed electronically. ?Number of Addenda: 0 ?Note Initiated On: 11/05/2021 9:10 AM ?Total Procedure Duration: 0 hours 5 minutes 18 seconds  ?Estimated Blood Loss:  Estimated blood loss: none. ?     [facility] ?

## 2021-11-05 NOTE — Anesthesia Preprocedure Evaluation (Signed)
Anesthesia Evaluation  ?Patient identified by MRN, date of birth, ID band ?Patient awake ? ? ? ?Reviewed: ?Allergy & Precautions, H&P , NPO status , Patient's Chart, lab work & pertinent test results ? ?History of Anesthesia Complications ?(+) PONV and history of anesthetic complications ? ?Airway ?Mallampati: II ? ?TM Distance: >3 FB ?Neck ROM: full ? ? ? Dental ?no notable dental hx. ? ?  ?Pulmonary ?sleep apnea ,  ?  ?Pulmonary exam normal ?breath sounds clear to auscultation ? ? ? ? ? ? Cardiovascular ?Normal cardiovascular exam ?Rhythm:regular Rate:Normal ? ? ?  ?Neuro/Psych ? Headaches,   ? GI/Hepatic ?GERD  ,  ?Endo/Other  ?Overweight ?RA ? Renal/GU ?  ? ?  ?Musculoskeletal ? ? Abdominal ?  ?Peds ? Hematology ?  ?Anesthesia Other Findings ? ? Reproductive/Obstetrics ? ?  ? ? ? ? ? ? ? ? ? ? ? ? ? ?  ?  ? ? ? ? ? ? ? ? ?Anesthesia Physical ?Anesthesia Plan ? ?ASA: 3 ? ?Anesthesia Plan: General  ? ?Post-op Pain Management: Minimal or no pain anticipated  ? ?Induction: Intravenous ? ?PONV Risk Score and Plan: 4 or greater and Treatment may vary due to age or medical condition, TIVA and Propofol infusion ? ?Airway Management Planned: Natural Airway ? ?Additional Equipment:  ? ?Intra-op Plan:  ? ?Post-operative Plan:  ? ?Informed Consent: I have reviewed the patients History and Physical, chart, labs and discussed the procedure including the risks, benefits and alternatives for the proposed anesthesia with the patient or authorized representative who has indicated his/her understanding and acceptance.  ? ? ? ?Dental Advisory Given ? ?Plan Discussed with: CRNA ? ?Anesthesia Plan Comments:   ? ? ? ? ? ? ?Anesthesia Quick Evaluation ? ?

## 2021-11-05 NOTE — Anesthesia Postprocedure Evaluation (Signed)
Anesthesia Post Note ? ?Patient: Sharon Barnes ? ?Procedure(s) Performed: ESOPHAGOGASTRODUODENOSCOPY (EGD) WITH PROPOFOL (Mouth) ? ? ?  ?Patient location during evaluation: PACU ?Anesthesia Type: General ?Level of consciousness: awake and alert and oriented ?Pain management: satisfactory to patient ?Vital Signs Assessment: post-procedure vital signs reviewed and stable ?Respiratory status: spontaneous breathing, nonlabored ventilation and respiratory function stable ?Cardiovascular status: blood pressure returned to baseline and stable ?Postop Assessment: Adequate PO intake and No signs of nausea or vomiting ?Anesthetic complications: no ? ? ?No notable events documented. ? ?Raliegh Ip ? ? ? ? ? ?

## 2021-11-06 ENCOUNTER — Encounter: Payer: Self-pay | Admitting: Gastroenterology

## 2021-11-06 NOTE — Telephone Encounter (Signed)
The pancreatic elastase is not back yet but will call her as soon as it is back. Will inform patient by mychart  ?

## 2021-11-09 ENCOUNTER — Encounter: Payer: Self-pay | Admitting: Gastroenterology

## 2021-11-09 LAB — PANCREATIC ELASTASE, FECAL: Pancreatic Elastase, Fecal: <50 ug Elast./g — ABNORMAL LOW (ref >200)

## 2021-11-10 ENCOUNTER — Telehealth: Payer: Self-pay

## 2021-11-10 LAB — SURGICAL PATHOLOGY

## 2021-11-10 MED ORDER — PANCRELIPASE (LIP-PROT-AMYL) 36000-114000 UNITS PO CPEP: ORAL_CAPSULE | ORAL | 5 refills | Status: DC

## 2021-11-10 NOTE — Telephone Encounter (Signed)
Patient had lots of questions of the results and why she would need it long term. Made her a appointment for Thursday to discuss this. Sent medication to the pharmacy  ?

## 2021-11-10 NOTE — Telephone Encounter (Signed)
-----   Message from Lin Landsman, MD sent at 11/09/2021 11:12 PM EDT ----- ?Caryl Pina ? ?Please inform patient that her pancreatic fecal elastase levels are very low which indicates severe pancreatic insufficiency and explains her GI symptoms.  She already had a CT scan of the pancreas which came back normal.  I do not recommend any further testing at this time ? ?Recommend to treat this condition with pancreatic enzymes Creon or Zenpep 2 capsules with first bite of each meal and 1 with snack.  This is a long-term treatment.  I am happy to see her for follow-up to discuss further ? ? ?Rohini Vanga ? ?

## 2021-11-12 ENCOUNTER — Ambulatory Visit (INDEPENDENT_AMBULATORY_CARE_PROVIDER_SITE_OTHER): Payer: BC Managed Care – PPO | Admitting: Gastroenterology

## 2021-11-12 ENCOUNTER — Encounter: Payer: Self-pay | Admitting: Gastroenterology

## 2021-11-12 ENCOUNTER — Other Ambulatory Visit: Payer: Self-pay

## 2021-11-12 VITALS — BP 114/74 | HR 69 | Temp 98.0°F | Ht 65.0 in | Wt 193.1 lb

## 2021-11-12 DIAGNOSIS — R1013 Epigastric pain: Secondary | ICD-10-CM | POA: Diagnosis not present

## 2021-11-12 DIAGNOSIS — K8681 Exocrine pancreatic insufficiency: Secondary | ICD-10-CM | POA: Diagnosis not present

## 2021-11-12 MED ORDER — RIFAXIMIN 550 MG PO TABS
550.0000 mg | ORAL_TABLET | Freq: Three times a day (TID) | ORAL | 0 refills | Status: AC
Start: 2021-11-12 — End: 2021-11-26

## 2021-11-12 NOTE — Patient Instructions (Addendum)
Your Gastric emptying study is schedule  for 12/04/21 arrived 8:00am for a 8:30am scan . Do not take omeprazole, Carafate, Promethazine, Zofran, Creon 8 hours before the scan. Nothing to eat or drink after midnight  ?Gave [facility] Samples  ?

## 2021-11-12 NOTE — Progress Notes (Signed)
?  ?Cephas Darby, MD ?[address]  ?[address]  ?[place],  [address]  ?Main: [phone]  ?Fax: [phone] ? ? ? ?Gastroenterology Consultation ? ?Referring Provider:     Idelle Crouch, MD ?Primary Care Physician:  Idelle Crouch, MD ?Primary Gastroenterologist:  Dr. Cephas Darby ?Reason for Consultation: Epigastric pain, nausea, unintentional weight loss      ? HPI:   ?Sharon Barnes is a 56 y.o. female referred by Dr. Doy Hutching, Leonie Douglas, MD  for consultation & management of symptomatic hemorrhoids and chronic GERD.  Patient reports that she has been suffering from hemorrhoidal symptoms including perianal itching, discomfort, prolapse, burning.  Also, she denies constipation, she does report straining.  She denies rectal bleeding.  She does report flareup of the symptoms about once or twice a month that are partially relieved with over-the-counter hemorrhoidal creams.  She wanted to discuss about hemorrhoid management before undergoing screening colonoscopy.  She also has history of chronic GERD, OSA on CPAP, nocturnal heartburn.  Her symptoms are responding to antireflux lifestyle, she is following dietary modifications which she thinks is helping.  She denies dysphagia, nausea or vomiting.  She does report abdominal bloating, denies abdominal pain.  She does have remote history of DVT with hypercoagulable work-up negative, history of lymphedema in lower extremities, seeing Dr. Festus Holts for lymphedema in her left upper arm and is scheduled to undergo ultrasound next week.  Patient is also evaluated by cardiology in the past for swelling of legs and cardiac work-up was negative ? ?Patient is a high Education officer, museum and teaches business ? ?She does not smoke, alcohol occasionally ? ?Follow-up visit 12/21/2019 ?Patient has been doing well with regards to her GERD symptoms. She reports that omeprazole 40 mg twice daily has resolved her reflux symptoms.  She also reports that her rectal bleeding has  resolved.  She would like to defer hemorrhoid ligation at this time ? ?Follow-up visit 10/29/2021 ?Patient has not seen me since 11/2019.  She made an urgent visit to see me because of approximately 2 months history of epigastric pain associated with nausea, unintentional weight loss.  Patient reports the pain is more or less constant, without any radiation, associated with abdominal bloating.  Patient lost about 20 pounds within last 6 weeks.  Her initial episode started end of January, symptoms including nausea, vomiting, loose stools epigastric pain.  She thought it was a stomach bug, was able to start feeling better, she then got diagnosed with COVID and her upper GI symptoms worsened.  She reports alternating diarrhea and constipation.  She underwent work-up including CT abdomen pelvis with contrast which was unremarkable, labs including CBC, CMP, troponins were normal.  She has been taking Zofran, Phenergan as needed for nausea.  She is taking Prilosec 40 mg daily.  She is also prescribed sucralfate 1 g 4 times daily.  She has tried Pepto-Bismol, Mylanta with some relief.  She is still not able to eat regular meals.  She has stopped taking Cimzia and methotrexate for her rheumatoid arthritis since onset of her upper GI symptoms. ? ?Follow-up visit 11/12/2021 ?Patient is here to discuss about abnormal pancreatic fecal elastase levels.  She underwent upper endoscopy with gastric and duodenal biopsies which came back normal. Pancreatic fecal elastase levels were <50 indicating severe pancreatic insufficiency.  Given her ongoing symptoms of epigastric and right upper quadrant discomfort associated with severe abdominal bloating and weight loss, patient lost about 5 pounds within last 2 weeks.  I started her on Creon 36 K 2 capsules with first bite of each meal and 1 with snack.  She started taking this pancreatic enzymes 2 days ago and has not seen any improvement.  She has several questions regarding this condition  and therefore made a follow-up visit.  She is watching her calorie intake and has an app on her phone and she shared with me today.  Her calorie intake is below the goal and also she has cut down on carbs as well as fat intake overall.  She is frustrated with ongoing symptoms and wondering if she would ever go back to normal eating.  She continues to take omeprazole 40 mg twice daily.  She is currently experiencing constipation and taking MiraLAX and Colace. ? ?NSAIDs: None ? ?Antiplts/Anticoagulants/Anti thrombotics: None ? ?GI Procedures: Reports having had an upper endoscopy more than 15 years ago ? ?Upper endoscopy 11/05/2021 ?- Normal duodenal bulb and second portion of the duodenum. Biopsied. ?- Normal stomach. Biopsied. ?- Normal gastroesophageal junction and esophagus. ?DIAGNOSIS:  ?A.  DUODENUM; COLD BIOPSY:  ?- DUODENAL MUCOSA WITH INTACT VILLI.  ?- NEGATIVE FOR ACTIVE INFLAMMATION, INTRAEPITHELIAL LYMPHOCYTOSIS, AND  ?INFECTIOUS AGENTS.  ? ?B.  STOMACH; COLD BIOPSY:  ?- ANTRAL MUCOSA WITH MILD CHRONIC INACTIVE GASTRITIS.  ?- OXYNTIC MUCOSA WITHOUT PATHOLOGIC CHANGES.  ?- NEGATIVE FOR H. PYLORI BY IMMUNOHISTOCHEMISTRY ([facility]).  ?- NEGATIVE FOR INTESTINAL METAPLASIA, DYSPLASIA, AND MALIGNANCY. ? ?EGD and colonoscopy 10/05/2019 ?- Normal duodenal bulb and second portion of the duodenum. ?- Erythematous mucosa in the gastric body. Biopsied. ?- Esophagogastric landmarks identified. ?- Normal gastroesophageal junction and esophagus. Biopsied. ? ?- Perianal skin tags found on perianal exam. ?- The examined portion of the ileum was normal. ?- One 5 mm polyp in the cecum, removed with mucosal resection. Resected and retrieved. ?- One 6 mm polyp in the ascending colon, removed with a cold snare. Resected and retrieved. ?- Non-bleeding external hemorrhoids. ?- The examination was otherwise normal. ?- Mucosal resection was performed. Resection and retrieval were complete. ? ?DIAGNOSIS:  ?A. STOMACH, RANDOM; BIOPSY:  ?-  OXYNTIC MUCOSA WITH NO SIGNIFICANT PATHOLOGIC ALTERATION.  ?- NEGATIVE FOR ACTIVE INFLAMMATION AND H PYLORI.  ?- NEGATIVE FOR INTESTINAL METAPLASIA, DYSPLASIA, AND MALIGNANCY.  ? ?B. ESOPHAGUS, RANDOM; BIOPSY:  ?- SQUAMOUS MUCOSA WITH NO SIGNIFICANT PATHOLOGIC ALTERATION.  ?- NEGATIVE FOR INFLAMMATION, INTESTINAL METAPLASIA, DYSPLASIA, AND  ?MALIGNANCY.  ? ?C. COLON POLYPS X2, CECUM AND ASCENDING; COLD SNARE:  ?- FRAGMENTS WITH FEATURES SUGGESTIVE OF SESSILE SERRATED POLYP (2).  ?- FRAGMENTS OF BENIGN INFLAMMATORY POLYP (2).  ?- NEGATIVE FOR DYSPLASIA AND MALIGNANCY.  ?She denies family history of GI malignancy ? ?Past Medical History:  ?Diagnosis Date  ? Arthritis   ? knees  ? DVT (deep venous thrombosis) ([facility]) 07/15/2014  ? Right leg  ? GERD (gastroesophageal reflux disease)   ? Hyperlipidemia   ? Lymphedema   ? Motion sickness   ? circular motion  ? Numbness of feet   ? PONV (postoperative nausea and vomiting)   ? after hysterectomy  ? Rheumatoid arthritis ([facility])   ? Sleep apnea   ? CPAP  ? ? ?Past Surgical History:  ?Procedure Laterality Date  ? ABDOMINAL HYSTERECTOMY    ? CHOLECYSTECTOMY    ? COLONOSCOPY WITH PROPOFOL N/A 10/05/2019  ? Procedure: COLONOSCOPY WITH PROPOFOL;  Surgeon: Lin Landsman, MD;  Location: [facility];  Service: Endoscopy;  Laterality: N/A;  ? ESOPHAGOGASTRODUODENOSCOPY (EGD) WITH PROPOFOL N/A 10/05/2019  ? Procedure: ESOPHAGOGASTRODUODENOSCOPY (EGD)  WITH PROPOFOL;  Surgeon: Lin Landsman, MD;  Location: [facility];  Service: Endoscopy;  Laterality: N/A;  ? ESOPHAGOGASTRODUODENOSCOPY (EGD) WITH PROPOFOL N/A 11/05/2021  ? Procedure: ESOPHAGOGASTRODUODENOSCOPY (EGD) WITH PROPOFOL;  Surgeon: Lin Landsman, MD;  Location: [facility];  Service: Endoscopy;  Laterality: N/A;  ? KNEE ARTHROSCOPY WITH MENISCAL REPAIR Right 07/24/2019  ? Procedure: KNEE ARTHROSCOPY WITH PARTIAL MENISECTOMY CHONDROPLASTY, PATELLOFEMORAL COMPARTMENT, [place]a Removal;  Surgeon:  Leim Fabry, MD;  Location: [facility];  Service: Orthopedics;  Laterality: Right;  sleep apnea  ? NASAL SINUS SURGERY    ? POLYPECTOMY  10/05/2019  ? Procedure: POLYPECTOMY;  Surgeon: Clarita Crane

## 2021-11-13 ENCOUNTER — Telehealth: Payer: Self-pay

## 2021-11-13 NOTE — Telephone Encounter (Signed)
The patient insurance denied the gastric emptying study and they said we needed to call [phone] to set up a peer to peer. Called this and set up a peer to peer for Dr. Marius Ditch  but they would not let me set up a time they said Dr. Marius Ditch had to call personal for the peer to peer. Call [phone] option 1,1,2 ?

## 2021-11-16 NOTE — Telephone Encounter (Signed)
Did peer to peer and gastric emptying study is approved, valid through 12/12/2021 ?Auth no# [id number] ? ?Valory Wetherby ? ? ?

## 2021-11-18 ENCOUNTER — Telehealth: Payer: Self-pay | Admitting: Gastroenterology

## 2021-11-18 NOTE — Telephone Encounter (Signed)
Patient states that she has not been able to have a bowel movement in a week. Patient needs advice, states that Dr Marius Ditch did not want her using laxatives. Requesting call back asap ?

## 2021-11-19 MED ORDER — LINACLOTIDE 290 MCG PO CAPS: 290.0000 ug | ORAL_CAPSULE | Freq: Every day | ORAL | 1 refills | Status: DC

## 2021-11-19 NOTE — Telephone Encounter (Signed)
Please have her try Linzess 269mg daily, she can try samples first if we have any ? ?RV ?

## 2021-11-19 NOTE — Addendum Note (Signed)
Addended by: Ulyess Blossom L on: 11/19/2021 10:54 AM ? ? Modules accepted: Orders ? ?

## 2021-11-19 NOTE — Telephone Encounter (Signed)
Patient verbalized understanding of instructions she will come pick up samples and sent medication to the pharmacy   ?

## 2021-11-27 ENCOUNTER — Encounter: Payer: Self-pay | Admitting: Gastroenterology

## 2021-11-30 ENCOUNTER — Other Ambulatory Visit: Payer: Self-pay | Admitting: Gastroenterology

## 2021-11-30 ENCOUNTER — Telehealth: Payer: Self-pay

## 2021-11-30 DIAGNOSIS — K8681 Exocrine pancreatic insufficiency: Secondary | ICD-10-CM

## 2021-11-30 NOTE — Telephone Encounter (Signed)
Released labs

## 2021-11-30 NOTE — Telephone Encounter (Signed)
-----   Message from Lin Landsman, MD sent at 11/30/2021  4:27 PM EDT ----- ?Regarding: Labs ?Please release her labs and let her know ? ?Thanks ?RV ? ?

## 2021-12-04 ENCOUNTER — Ambulatory Visit
Admission: RE | Admit: 2021-12-04 | Discharge: 2021-12-04 | Disposition: A | Discharge status: Home / Self Care | Payer: BC Managed Care – PPO | Source: Ambulatory Visit | Attending: Gastroenterology | Admitting: Gastroenterology

## 2021-12-04 DIAGNOSIS — K30 Functional dyspepsia: Secondary | ICD-10-CM | POA: Insufficient documentation

## 2021-12-04 LAB — IRON,TIBC AND FERRITIN PANEL
Ferritin: 218 ng/mL — ABNORMAL HIGH (ref 15–150)
Iron Saturation: 21 % (ref 15–55)
Iron: 62 ug/dL (ref 27–159)
Total Iron Binding Capacity: 301 ug/dL (ref 250–450)
UIBC: 239 ug/dL (ref 131–425)

## 2021-12-04 LAB — VITAMIN K1, SERUM: VITAMIN K1: 0.25 ng/mL (ref 0.10–2.20)

## 2021-12-04 LAB — VITAMIN E
Vitamin E (Alpha Tocopherol): 7.9 mg/L (ref 7.0–25.1)
Vitamin E(Gamma Tocopherol): 0.7 mg/L (ref 0.5–5.5)

## 2021-12-04 LAB — FOLATE: Folate: 11.8 ng/mL (ref >3.0)

## 2021-12-04 LAB — VITAMIN B12: Vitamin B-12: 522 pg/mL (ref 232–1245)

## 2021-12-04 LAB — VITAMIN D 25 HYDROXY (VIT D DEFICIENCY, FRACTURES): Vit D, 25-Hydroxy: 33.9 ng/mL (ref 30.0–100.0)

## 2021-12-04 LAB — VITAMIN A: Vitamin A: 35.7 ug/dL (ref 20.1–62.0)

## 2021-12-04 MED ORDER — TECHNETIUM TC 99M SULFUR COLLOID
2.0000 | Freq: Once | INTRAVENOUS | Status: AC | PRN
  Administered 2021-12-04: 2.27 via ORAL

## 2021-12-07 ENCOUNTER — Telehealth: Payer: Self-pay

## 2021-12-07 ENCOUNTER — Encounter: Payer: Self-pay | Admitting: Gastroenterology

## 2021-12-07 NOTE — Telephone Encounter (Signed)
-----   Message from Lin Landsman, MD sent at 12/07/2021  3:57 PM EDT ----- ?Gastric emptying study confirmed gastroparesis which is a delayed gastric emptying.  I see that patient has appointment with [facility] on 4/19.  Is patient switching her care? ? ?RV ?

## 2021-12-07 NOTE — Telephone Encounter (Signed)
Patient verbalized understanding of results. She states yes she is seeing [facility] for a Second opinion. Informed patient that our office does not like patients to go back and forth between GI doctors. She states that she can not go get a second opinion and come back and I said no she can do this but the providers do not like you going from one doctor to the next. They only want you to have one GI provider. Patient states it is her health and she will see who she wants when she wants to see them  ?

## 2021-12-28 ENCOUNTER — Encounter: Payer: Self-pay | Admitting: Dietician

## 2021-12-28 ENCOUNTER — Encounter: Payer: BC Managed Care – PPO | Attending: Gastroenterology | Admitting: Dietician

## 2021-12-28 VITALS — Ht 65.0 in | Wt 180.4 lb

## 2021-12-28 DIAGNOSIS — K3184 Gastroparesis: Secondary | ICD-10-CM

## 2021-12-28 DIAGNOSIS — K219 Gastro-esophageal reflux disease without esophagitis: Secondary | ICD-10-CM | POA: Diagnosis not present

## 2021-12-28 NOTE — Patient Instructions (Signed)
Continue to try more low fat, low fiber foods one at a time, to increase variety in the diet.  ?Use liquid nutrition as needed to reach protein and calorie needs.  ?Include a source of protein with each meal and snack, aiming for at least 60g daily. ?Keep up habit of light walking for 10 or more minutes after eating to help control GERD symptoms and promote movement through GI tract. ?

## 2021-12-28 NOTE — Progress Notes (Signed)
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 ?Preferred learning method:  ?Visual ? ? ?Current weight: 180.4lbs Height: 5'5" BMI: 30.02 ? ?Medications, supplements: reconciled list in medical record ? ?Progress and evaluation:  ?Patient reports diagnosis of EPI, took Creon for about 1 month, now retesting to confirm diagnosis and will resume Creon if needed. Lipase level tested 12/23/21 was low at 9 U/L. ?She reports having symptoms of abdominal pain, nausea, bloating and constipation since 08/2021. 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 ?Other pertinent labs: (12/01/21) vitamin B12 normal at 522pg/ml; vit D 33.9 ng/ml; vit A 35.7ug/dl; vit E (alpha) 7.'9mg'$ /L, (gamma) 0.7 mg/L; vit K1 0.25 ng/ml ?[place] intake with My Fitness Pal, struggles to reach 1100kcal daily 45-50g protein; she is following low fat and low fiber diet, aiming for <20g fat daily but often closer to 10g daily.  ?Has lost 34lbs since end January 2023 due to GI symptoms limiting intake. Weight loss has begun to slow down per patient.  ?She reports some hair loss recently. ? ? ?Physical activity: walking 15 minutes daily ? ?Dietary Intake:  ?Usual eating pattern includes 3 meals and 1-2 snacks per day. ?Dining out frequency: 0 meals per week. ? ?Breakfast: 2 boiled eggs with 1/2 of one yolk, 1 sl white toast, grits/ cream of rice with maple syrup with butter spray, grape juice diluted with water; yogurt Chobani zero lactos zero sugar, with canned peaches; cornflakes with soy milk or 1% lactaid milk ?Snack: boost protein smoothie made with and fruit; saltine crackers ?Lunch: shredded skinless chicken breast with rice, occ 1/2 baked potato or sweet potato, V8 juice ?Snack: yogurt with peaches, juice; crackers with smoothie; same as  am ?Supper: cornflakes; cream of rice; omelet with mushrooms and spinach; light meal as GI symptoms are worst at night ?Snack: none ?Beverages: water, juice, gatorade; smoothies; ginger lemon tea ? ?Intervention:  ? ?Nutrition Care Education: ?  ?Weight control: estimated energy needs to prevent unintentional weight loss at 1200-1500 kcal ?Gastroparesis/ GERD: low fat and low fiber diet and food choices; appropriate liquid food choices; consuming small, frequent meals and snacks; chewing foods thoroughly; avoiding acidic, spicy, very hot or very cold foods; staying upright after eating;  ?Pancreatic Insufficiency symptoms: low fat diet <20g daily ? ? ?Nutritional Diagnosis:  [place]-1.4 Altered GI function As related to gastroparesis and possible EPI.  As evidenced by abdominal pain, bloating, nausea, constipation. ? ? ?Education Materials given:  ?Gastroparesis Nutrition Therapy (NCM) ?Fat free white sauce, macaroni and cheese recipe ?Visit summary with goals/ instructions to be viewed via MyChart ? ? ?Learner/ who was taught:  ?Patient  ? ?Level of understanding: ?Verbalizes/ demonstrates competency ? ? ?Demonstrated degree of understanding via:   Teach back ?Learning barriers: ?None ? ?Willingness to learn/ readiness for change: ?Eager, change in progress ? ? ?Monitoring and Evaluation:  Dietary intake, exercise, GI symptoms, and body weight ?     follow up:  01/14/22 at 5:15pm   ?

## 2022-01-14 ENCOUNTER — Encounter: Payer: Self-pay | Admitting: Dietician

## 2022-01-14 ENCOUNTER — Encounter (INDEPENDENT_AMBULATORY_CARE_PROVIDER_SITE_OTHER): Payer: BC Managed Care – PPO | Admitting: Dietician

## 2022-01-14 VITALS — Wt 181.9 lb

## 2022-01-14 DIAGNOSIS — K3184 Gastroparesis: Secondary | ICD-10-CM | POA: Diagnosis not present

## 2022-01-14 DIAGNOSIS — K219 Gastro-esophageal reflux disease without esophagitis: Secondary | ICD-10-CM | POA: Diagnosis not present

## 2022-01-14 NOTE — Progress Notes (Signed)
Medical Nutrition Therapy: Visit start time: 1720  end time: 1810  Assessment:  Diagnosis: gastroparesis Medical history changes: no changes per patient Psychosocial issues/ stress concerns: none  Current weight: 181.9lbs Height: 5'5" BMI: 30.27  Medications, supplement changes: no changes per patient  Progress and evaluation:  Patient reports EPI unlikely per recent improved test result (150s, so still below normal) Reports having felt better when she was taking Creon, so has resumed for now.  Has appt [facility] in 2 weeks as likely cause for at least some symptoms. Bouts of more severe nausea are now shorter in duration with resuming Creon and additional diet changes.  Weight loss has stopped for now.   Physical activity: walking daily 15 minutes 2-3 times per day  Dietary Intake:  Usual eating pattern includes 3 meals and 2 snacks per day.  Breakfast: scrambled egg sandwich + 1/2 banana or applesauce Snack: yogurt with peaches or banana Lunch: grilled/ shredded/ ground chicken with rice or potato Snack: occ fruit juice or small amount of nectar Supper: cream of rice or wheat cereal; carnation breakfast; smoothie + 5 crackers -- eats 2+ hours before bedtime Snack: none Beverages: water, smaller amounts of juice, some gatorade, smoothies, ginger lemon tea  Intervention:   Nutrition Care Education:  Basic nutrition: reviewed appropriate nutrient balance; appropriate meal and snack schedule; general nutrition guidelines    Gastroparesis: discussed appropriate food choices for low fat and low fiber eating pattern; bezoars and prevention; managing nighttime symptoms  Other Notes: Additional follow up scheduled for 03/2022 per patient request -- goal for that visit will be to plan for dietary management with return to school   Nutritional Diagnosis:  [place]-1.4 Altered GI function As related to gastroparesis.  As evidenced by abdominal pain, bloating, nausea,  constipation.   Education Materials given:  Bezoars article, Nutrition Issues in Gastroenterology, series #208 Visit summary to be viewed in patient portal   Learner/ who was taught:  Patient    Level of understanding: Verbalizes/ demonstrates competency   Demonstrated degree of understanding via:   Teach back Learning barriers: None  Willingness to learn/ readiness for change: Eager, change in progress  Monitoring and Evaluation:  Dietary intake, exercise, GI symptoms, and body weight      follow up:  03/25/22

## 2022-03-25 ENCOUNTER — Encounter: Payer: BC Managed Care – PPO | Attending: Gastroenterology | Admitting: Dietician

## 2022-03-25 ENCOUNTER — Encounter: Payer: Self-pay | Admitting: Dietician

## 2022-03-25 VITALS — Ht 65.0 in | Wt 166.7 lb

## 2022-03-25 DIAGNOSIS — K219 Gastro-esophageal reflux disease without esophagitis: Secondary | ICD-10-CM

## 2022-03-25 DIAGNOSIS — K3184 Gastroparesis: Secondary | ICD-10-CM

## 2022-03-25 NOTE — Patient Instructions (Signed)
Great job eating at regular intervals, keep it up! OK to increase liquid nutrition drinks if needed for more calories.  Try adding powdered milk or unflavored protein powder into low fat pudding, low fat mashed potatoes, soups, etc for an extra calorie boost. Try 1-2 bites of canned green beans to see if they are tolerated. Winter squash is very nutritious and can be cooked and pureed with protein powder added, or made into soup. Choose high potassium foods that are tolerated to ensure adequate intake.  Iron and calcium will be other nutrients to monitor -- add supplements if blood levels are low. Multivitamin should cover other nutrients.

## 2022-03-25 NOTE — Progress Notes (Signed)
Medical Nutrition Therapy: Visit start time: 1325  end time: 1400  Assessment:  Diagnosis: gastroparesis Medical history changes: no changes Psychosocial issues/ stress concerns: none  Medications, supplement changes: updated medication list in medical record   Current weight: 166.7lbs Height: 5'5" BMI: 27.74  Progress and evaluation:  Continues with limited variety of tolerated foods Has met with specialist for long covid effects, also seeing occupational therapy, once improved will begin physical therapy. Planning for gradual return to work, ie 3 days a week to start Nausea most pronounced at night, prilosec increased to 2x daily as of yesterday Patient voices concern over potential nutritional deficiencies, and feels she is not consuming adequate potassium.     Dietary Intake:  Usual eating pattern includes 3 meals and 1-2 snacks per day. Dining out frequency: 0 meals per week.  Breakfast: egg sandwich/ creamy rice with egg whites/ pancakes and egg whites/ cream of wheat with syrup and banana + egg whites/ grits + egg + toast Snack: one Lunch: chicken/ salmon/ tuna / PB2/ egg whites + veg and/or fruit + pasta/ rice/ bread/ tortilla/ potato Snack: cereal with lactaid milk and fruit/ yogurt with fruit and pretzels/ grits/ toast/ sweet potato/ 1/2 sandwich/ animal crackers/ pretzels Supper: carnation breakfast drink/ smoothie/ boost + small amount of rice or crackers Snack: none Beverages: water, gatorade zero, juice  Physical activity: ADLs, occupational therapy  Intervention:   Nutrition Care Education:  Basic nutrition: reviewed appropriate nutrient balance Gastroparesis: reviewed progress since previous visit; discussed options for meals during school day; discussed additional protein supplement drinks as needed for snacks, to prevent further weight loss; discussed potential nutrition deficiencies and monitoring vitamin/ mineral status; instructed on food sources of  potassium  Other intervention notes: Patient is hopeful for gradual improvement in GI symptoms, but continues to work to find adequate and varied eating pattern that controls symptoms.  Updated goals with direction from patient. No future follow up scheduled at this time; patient to schedule later if needed.  Nutritional Diagnosis:   [place]-1.4 Altered GI function As related to gastroparesis.  As evidenced by abdominal pain, bloating, nausea, constipation.   Education Materials given:  Visit summary with goals/ instructions   Learner/ who was taught:  Patient   Level of understanding: Verbalizes/ demonstrates competency   Demonstrated degree of understanding via:   Teach back Learning barriers: None  Willingness to learn/ readiness for change: Eager, change in progress   Monitoring and Evaluation:  Dietary intake, exercise, GI symptoms, and body weight      follow up: prn

## 2022-06-21 ENCOUNTER — Encounter (INDEPENDENT_AMBULATORY_CARE_PROVIDER_SITE_OTHER): Payer: Self-pay

## 2022-07-02 ENCOUNTER — Encounter (HOSPITAL_BASED_OUTPATIENT_CLINIC_OR_DEPARTMENT_OTHER): Payer: Self-pay

## 2022-07-02 DIAGNOSIS — R0683 Snoring: Secondary | ICD-10-CM

## 2022-07-02 DIAGNOSIS — G4710 Hypersomnia, unspecified: Secondary | ICD-10-CM

## 2022-07-28 ENCOUNTER — Ambulatory Visit (HOSPITAL_BASED_OUTPATIENT_CLINIC_OR_DEPARTMENT_OTHER): Payer: BC Managed Care – PPO | Attending: Internal Medicine | Admitting: Internal Medicine

## 2022-07-28 VITALS — Ht 65.0 in | Wt 156.0 lb

## 2022-07-28 DIAGNOSIS — G4710 Hypersomnia, unspecified: Secondary | ICD-10-CM

## 2022-07-28 DIAGNOSIS — R0683 Snoring: Secondary | ICD-10-CM | POA: Diagnosis present

## 2022-07-28 DIAGNOSIS — R519 Headache, unspecified: Secondary | ICD-10-CM | POA: Insufficient documentation

## 2022-08-01 DIAGNOSIS — R0683 Snoring: Secondary | ICD-10-CM | POA: Diagnosis not present

## 2022-08-01 NOTE — Procedures (Signed)
    Patient Name: Sharon Barnes, Sharon Barnes Date: 07/28/2022 Gender: Female D.O.B: 1966/02/26 Age (years): 56 Referring Provider: Idelle Crouch Height (inches): 41 Interpreting Physician: Baird Lyons MD, ABSM Weight (lbs): 156 RPSGT: Carolin Coy BMI: 26 MRN: [id number] Neck Size: 14.50  CLINICAL INFORMATION Sleep Study Type: NPSG Indication for sleep study: Morning Headaches, Snoring Epworth Sleepiness Score: 14  SLEEP STUDY TECHNIQUE As per the AASM Manual for the Scoring of Sleep and Associated Events v2.3 (April 2016) with a hypopnea requiring 4% desaturations.  The channels recorded and monitored were frontal, central and occipital EEG, electrooculogram (EOG), submentalis EMG (chin), nasal and oral airflow, thoracic and abdominal wall motion, anterior tibialis EMG, snore microphone, electrocardiogram, and pulse oximetry.  MEDICATIONS Medications self-administered by patient taken the night of the study : FLONASE, GABAPENTIN, [place] The study was initiated at 10:38:32 PM and ended at 4:56:21 AM.  Sleep onset time was 15.5 minutes and the sleep efficiency was 79.0%. The total sleep time was 298.5 minutes.  Stage REM latency was 242.0 minutes.  The patient spent 17.4% of the night in stage N1 sleep, 73.2% in stage N2 sleep, 0.0% in stage N3 and 9.4% in REM.  Alpha intrusion was absent.  Supine sleep was 25.52%.  RESPIRATORY PARAMETERS The overall apnea/hypopnea index (AHI) was 1.0 per hour. There were 1 total apneas, including 0 obstructive, 1 central and 0 mixed apneas. There were 4 hypopneas and 97 RERAs.  The AHI during Stage REM sleep was 4.3 per hour.  AHI while supine was 1.6 per hour.  The mean oxygen saturation was 93.2%. The minimum SpO2 during sleep was 88.0%.  moderate snoring was noted during this study.  CARDIAC DATA The 2 lead EKG demonstrated sinus rhythm. The mean heart rate was 54.0 beats per minute. Other EKG findings  include: None.  LEG MOVEMENT DATA The total PLMS were 0 with a resulting PLMS index of 0.0. Associated arousal with leg movement index was 0.0 .  IMPRESSIONS - No significant obstructive sleep apnea occurred during this study (AHI = 1.0/h). - No significant central sleep apnea occurred during this study (CAI = 0.2/h). - The patient had minimal oxygen desaturation during the study (Min O2 = 88.0%) Mean 93.2%. - The patient snored with moderate snoring volume. Occasional PACs. - No cardiac abnormalities were noted during this study. - Clinically significant periodic limb movements did not occur during sleep. No significant associated arousals.  DIAGNOSIS - Primary Snoring  RECOMMENDATIONS - Manage for snoring and symptoms based on clinical judgment. - Sleep hygiene should be reviewed to assess factors that may improve sleep quality. - Weight management and regular exercise should be initiated or continued if appropriate.  [Electronically signed] 08/01/2022 12:06 PM  Baird Lyons MD, [place], American Board of Sleep Medicine NPI: [id number]                         [place] [place] [facility]  ELECTRONICALLY SIGNED ON:  08/01/2022, 12:03 PM [place] PH: [phone]   FX: [phone] [place]

## 2022-10-02 ENCOUNTER — Other Ambulatory Visit: Payer: Self-pay | Admitting: Gastroenterology

## 2023-09-05 ENCOUNTER — Other Ambulatory Visit: Payer: Self-pay | Admitting: Internal Medicine

## 2023-09-05 DIAGNOSIS — R1031 Right lower quadrant pain: Secondary | ICD-10-CM

## 2023-09-06 ENCOUNTER — Ambulatory Visit
Admission: RE | Admit: 2023-09-06 | Discharge: 2023-09-06 | Disposition: A | Discharge status: Home / Self Care | Payer: Self-pay | Source: Ambulatory Visit | Attending: Internal Medicine | Admitting: Internal Medicine

## 2023-09-06 DIAGNOSIS — R1031 Right lower quadrant pain: Secondary | ICD-10-CM

## 2023-12-06 ENCOUNTER — Other Ambulatory Visit: Payer: Self-pay | Admitting: Internal Medicine

## 2023-12-06 DIAGNOSIS — Z1231 Encounter for screening mammogram for malignant neoplasm of breast: Secondary | ICD-10-CM

## 2024-05-01 ENCOUNTER — Ambulatory Visit
Admission: RE | Admit: 2024-05-01 | Discharge: 2024-05-01 | Disposition: A | Discharge status: Home / Self Care | Source: Ambulatory Visit | Attending: Internal Medicine | Admitting: Internal Medicine

## 2024-05-01 DIAGNOSIS — Z1231 Encounter for screening mammogram for malignant neoplasm of breast: Secondary | ICD-10-CM | POA: Diagnosis present

## 2024-05-03 ENCOUNTER — Inpatient Hospital Stay
Admission: RE | Admit: 2024-05-03 | Discharge: 2024-05-03 | Disposition: A | Discharge status: Home / Self Care | Payer: Self-pay | Source: Ambulatory Visit | Attending: Internal Medicine | Admitting: Internal Medicine

## 2024-05-03 ENCOUNTER — Other Ambulatory Visit: Payer: Self-pay | Admitting: *Deleted

## 2024-05-03 DIAGNOSIS — Z1231 Encounter for screening mammogram for malignant neoplasm of breast: Secondary | ICD-10-CM

## 2024-06-20 ENCOUNTER — Encounter: Payer: Self-pay | Admitting: Physical Medicine & Rehabilitation

## 2024-06-20 ENCOUNTER — Other Ambulatory Visit: Payer: Self-pay | Admitting: Physical Medicine & Rehabilitation

## 2024-06-20 DIAGNOSIS — M542 Cervicalgia: Secondary | ICD-10-CM

## 2024-06-23 ENCOUNTER — Ambulatory Visit
Admission: RE | Admit: 2024-06-23 | Discharge: 2024-06-23 | Disposition: A | Discharge status: Home / Self Care | Source: Ambulatory Visit | Attending: Physical Medicine & Rehabilitation | Admitting: Physical Medicine & Rehabilitation

## 2024-06-23 DIAGNOSIS — M542 Cervicalgia: Secondary | ICD-10-CM
